# Patient Record
Sex: Female | Born: 1960
Health system: Southern US, Community
[De-identification: ages and names within clinical notes are randomized; demographics above are authoritative.]

## PROBLEM LIST (undated history)

## (undated) DIAGNOSIS — M7500 Adhesive capsulitis of unspecified shoulder: Secondary | ICD-10-CM

## (undated) DIAGNOSIS — R112 Nausea with vomiting, unspecified: Secondary | ICD-10-CM

## (undated) DIAGNOSIS — R51 Headache: Secondary | ICD-10-CM

## (undated) DIAGNOSIS — F410 Panic disorder [episodic paroxysmal anxiety] without agoraphobia: Secondary | ICD-10-CM

## (undated) DIAGNOSIS — R519 Headache, unspecified: Secondary | ICD-10-CM

## (undated) DIAGNOSIS — Z973 Presence of spectacles and contact lenses: Secondary | ICD-10-CM

## (undated) DIAGNOSIS — Z9889 Other specified postprocedural states: Secondary | ICD-10-CM

## (undated) DIAGNOSIS — M1990 Unspecified osteoarthritis, unspecified site: Secondary | ICD-10-CM

## (undated) HISTORY — PX: CYST EXCISION: SHX5701

## (undated) HISTORY — PX: COLONOSCOPY WITH ESOPHAGOGASTRODUODENOSCOPY (EGD): SHX5779

## (undated) HISTORY — PX: TONSILLECTOMY: SUR1361

---

## 2015-12-30 ENCOUNTER — Emergency Department (INDEPENDENT_AMBULATORY_CARE_PROVIDER_SITE_OTHER): Payer: Managed Care, Other (non HMO)

## 2015-12-30 ENCOUNTER — Emergency Department (INDEPENDENT_AMBULATORY_CARE_PROVIDER_SITE_OTHER)
Admission: EM | Admit: 2015-12-30 | Discharge: 2015-12-30 | Disposition: A | Discharge status: Home / Self Care | Payer: Managed Care, Other (non HMO) | Source: Home / Self Care | Attending: Family Medicine | Admitting: Family Medicine

## 2015-12-30 ENCOUNTER — Encounter: Payer: Self-pay | Admitting: *Deleted

## 2015-12-30 DIAGNOSIS — M7541 Impingement syndrome of right shoulder: Secondary | ICD-10-CM

## 2015-12-30 DIAGNOSIS — M25511 Pain in right shoulder: Secondary | ICD-10-CM | POA: Diagnosis not present

## 2015-12-30 DIAGNOSIS — R0781 Pleurodynia: Secondary | ICD-10-CM | POA: Diagnosis not present

## 2015-12-30 DIAGNOSIS — M940 Chondrocostal junction syndrome [Tietze]: Secondary | ICD-10-CM

## 2015-12-30 MED ORDER — NAPROXEN 500 MG PO TABS: 500.0000 mg | ORAL_TABLET | Freq: Two times a day (BID) | ORAL | 1 refills | Status: DC

## 2015-12-30 NOTE — ED Triage Notes (Signed)
Pt c/o RT shoulder pain x 1 year. Denies injury. She saw sports med 11/16' received an injection in her shoulder without relief. For the past 2 days she c/o RUQ abd pain that goes through to her back. She is unsure if it is related to her shoulder pain.

## 2015-12-30 NOTE — ED Provider Notes (Signed)
[facility]    CSN: [id number] Arrival date & time: 12/30/15  1257     History   Chief Complaint Chief Complaint  Patient presents with  . Shoulder Pain    HPI Nicole Gray is a 55 y.o. female.   Patient recently moved to the area.  She complains of persistent right shoulder pain for about a year. She recalls that her right shoulder pain started when she reached back with her right hand/arm to pick up something, and felt a sharp sudden pain in her right shoulder.  The pain has persisted, worse with abduction.  She visited a PCP after the pain started, who injected her right shoulder with a corticosteroid which she states was not effective.  She also went to physical therapy for 3 sessions that also was not helpful. She also complains of a very mild cough for about 2 months without fever, wheezing, or shortness of breath.  Last night she developed sharp pain in her right anterior/inferior chest beneath her right breast.  The pain is worse with movement and inspiration, and radiates to her right shoulder and upper back.  She recalls no injury to her chest.  No recent URI symptoms.                                                           The history is provided by the patient.  Shoulder Pain  Location:  Shoulder Shoulder location:  R shoulder Injury: no   Pain details:    Quality:  Sharp and aching   Radiates to:  Back   Severity:  Mild   Onset quality:  Sudden   Duration:  12 months   Timing:  Constant   Progression:  Worsening Handedness:  Right-handed Dislocation: no   Prior injury to area:  No Relieved by:  NSAIDs Worsened by:  Movement Ineffective treatments:  NSAIDs Associated symptoms: back pain and decreased range of motion   Associated symptoms: no fatigue, no fever, no muscle weakness, no neck pain, no numbness, no stiffness, no swelling and no tingling   Risk factors: no recent illness   Chest Pain  Pain location:  R chest Pain quality: sharp     Pain radiates to:  R shoulder and upper back Pain severity:  Mild Onset quality:  Sudden Duration:  1 day Timing:  Constant Progression:  Unchanged Chronicity:  New Context: breathing, lifting, movement, raising an arm and at rest   Context: not stress and not trauma   Relieved by:  None tried Worsened by:  Certain positions, coughing, deep breathing and movement Ineffective treatments:  None tried Associated symptoms: back pain, cough and shortness of breath   Associated symptoms: no abdominal pain, no anorexia, no diaphoresis, no dysphagia, no fatigue, no fever, no heartburn, no lower extremity edema, no nausea, no near-syncope, no numbness, no palpitations and no vomiting   Risk factors: no smoking     History reviewed. No pertinent past medical history.  There are no active problems to display for this patient.   Past Surgical History:  Procedure Laterality Date  . TONSILLECTOMY      OB History    No data available       Home Medications    Prior to Admission medications   Medication Sig Start Date End  Date Taking? Authorizing Provider  naproxen (NAPROSYN) 500 MG tablet Take 1 tablet (500 mg total) by mouth 2 (two) times daily. (every 12 hours with food) 12/30/15   Lattie HawStephen A Jaeliana Lococo, MD    Family History Family History  Problem Relation Age of Onset  . Cancer Mother     breast    Social History Social History  Substance Use Topics  . Smoking status: Never Smoker  . Smokeless tobacco: Never Used  . Alcohol use Yes     Allergies   Patient has no known allergies.   Review of Systems Review of Systems  Constitutional: Negative for diaphoresis, fatigue and fever.  HENT: Negative for trouble swallowing.   Respiratory: Positive for cough and shortness of breath.   Cardiovascular: Positive for chest pain. Negative for palpitations and near-syncope.  Gastrointestinal: Negative for abdominal pain, anorexia, heartburn, nausea and vomiting.  Musculoskeletal:  Positive for back pain. Negative for neck pain and stiffness.  Neurological: Negative for numbness.  All other systems reviewed and are negative.    Physical Exam Triage Vital Signs ED Triage Vitals [12/30/15 1317]  Enc Vitals Group     BP 115/73     Pulse Rate 71     Resp 16     Temp 98.2 F (36.8 C)     Temp Source Oral     SpO2 98 %     Weight 157 lb (71.2 kg)     Height 5\' 3"  (1.6 m)     Head Circumference      Peak Flow      Pain Score      Pain Loc      Pain Edu?      Excl. in GC?    No data found.   Updated Vital Signs BP 115/73 (BP Location: Left Arm)   Pulse 71   Temp 98.2 F (36.8 C) (Oral)   Resp 16   Ht 5\' 3"  (1.6 m)   Wt 157 lb (71.2 kg)   SpO2 98%   BMI 27.81 kg/m   Visual Acuity Right Eye Distance:   Left Eye Distance:   Bilateral Distance:    Right Eye Near:   Left Eye Near:    Bilateral Near:     Physical Exam  Constitutional: She appears well-developed and well-nourished. No distress.  HENT:  Head: Normocephalic.  Right Ear: External ear normal.  Left Ear: External ear normal.  Nose: Nose normal.  Mouth/Throat: Oropharynx is clear and moist.  Eyes: Conjunctivae are normal. Pupils are equal, round, and reactive to light.  Neck: Normal range of motion. Neck supple.  Cardiovascular: Normal heart sounds.   Pulmonary/Chest: Breath sounds normal.        There is distinct tenderness over medial edges of right scapula, extending up to trapezius muscle.  There is tenderness to palpation over the right anterior costal margin and lower ribs as noted on diagram.    Abdominal: There is no tenderness.  Musculoskeletal: She exhibits no edema.       Right shoulder: She exhibits decreased range of motion, tenderness and abnormal pulse. She exhibits normal strength.       Arms: Right shoulder:  Patient has difficulty actively and passively abducting more than 20 degrees above horizontal.  She is able to perform Apley's test but with  difficulty.  Empty can test negative.  Good internal/external rotation range of motion and strength.  There is distinct tenderness to palpation over the right long head of biceps tendon.  There is also tenderness to palpation over the sub-acromial bursa.  Distal neurovascular function is intact.   Lymphadenopathy:    She has no cervical adenopathy.  Neurological: She is alert.  Skin: Skin is warm and dry. No rash noted.  Nursing note and vitals reviewed.    UC Treatments / Results  Labs (all labs ordered are listed, but only abnormal results are displayed) Labs Reviewed - No data to display  EKG  EKG Interpretation None       Radiology Dg Ribs Unilateral W/chest Right  Result Date: 12/30/2015 CLINICAL DATA:  Right lower rib pain for 1-2 weeks EXAM: RIGHT RIBS AND CHEST - 3+ VIEW COMPARISON:  None. FINDINGS: No fracture or other bone lesions are seen involving the ribs. There is no evidence of pneumothorax or pleural effusion. Both lungs are clear. Heart size and mediastinal contours are within normal limits. IMPRESSION: Negative. Electronically Signed   By: Jasmine PangKim  Fujinaga M.D.   On: 12/30/2015 14:15   Dg Shoulder Right  Result Date: 12/30/2015 CLINICAL DATA:  Persistent right shoulder pain EXAM: RIGHT SHOULDER - 2+ VIEW COMPARISON:  None. FINDINGS: There is no evidence of fracture or dislocation. Mild right AC joint degenerative change. The right lung apex is clear. IMPRESSION: No acute osseous abnormality Electronically Signed   By: Jasmine PangKim  Fujinaga M.D.   On: 12/30/2015 14:14    Procedures Procedures (including critical care time)  Medications Ordered in UC Medications - No data to display   Initial Impression / Assessment and Plan / UC Course  I have reviewed the triage vital signs and the nursing notes.  Pertinent labs & imaging results that were available during my care of the patient were reviewed by me and considered in my medical decision making (see chart for  details).  Clinical Course   Discussed patient with Dr. Clementeen GrahamEvan Corey. Begin Naproxen 500mg  BID pc. Apply ice pack for 20 to 30 minutes, 3 to 4 times daily  Continue until pain decreases. Begin range of motion and stretching exercises as tolerated. Followup with Dr. Clementeen GrahamEvan Corey ([facility]) for management.     Final Clinical Impressions(s) / UC Diagnoses   Final diagnoses:  Impingement syndrome of right shoulder  Costochondritis, acute    New Prescriptions Discharge Medication List as of 12/30/2015  3:06 PM    START taking these medications   Details  naproxen (NAPROSYN) 500 MG tablet Take 1 tablet (500 mg total) by mouth 2 (two) times daily. (every 12 hours with food), Starting Tue 12/30/2015, Print         Lattie HawStephen A Kynsley Whitehouse, MD 12/30/15 [phone]

## 2015-12-30 NOTE — Discharge Instructions (Signed)
Apply ice pack for 20 to 30 minutes, 3 to 4 times daily  Continue until pain decreases.  °Begin range of motion and stretching exercises as tolerated. °

## 2016-01-01 ENCOUNTER — Encounter: Payer: Self-pay | Admitting: Family Medicine

## 2016-01-01 ENCOUNTER — Ambulatory Visit (INDEPENDENT_AMBULATORY_CARE_PROVIDER_SITE_OTHER): Payer: Managed Care, Other (non HMO) | Admitting: Family Medicine

## 2016-01-01 DIAGNOSIS — M25511 Pain in right shoulder: Secondary | ICD-10-CM | POA: Insufficient documentation

## 2016-01-01 NOTE — Patient Instructions (Signed)
Thank you for coming in today. You should hear about MRI soon.  Make an appointment with me a few days after the MRI to go over findings.

## 2016-01-01 NOTE — Progress Notes (Signed)
   Subjective:    I'm seeing this patient as a consultation for:  Dr. Donna ChristenStephen Beese  CC: shoulder pain  HPI: Patient has had right shoulder pain for the past year. Woke up with it one morning; no trauma or precipitating event. She has tried NSAIDs, 4 sessions of PT, and a corticosteroid injection, none of which have helped. The pain is achy, located across the front of her shoulder, and goes down into her elbow. It is worse with movement. Denies numbness, tingling, or weakness in right arm. A few days ago, she woke up with point tenderness over her right upper back.  Past medical history, Surgical history, Family history not pertinant except as noted below, Social history, Allergies, and medications have been entered into the medical record, reviewed, and no changes needed.   Review of Systems: No headache, visual changes, nausea, vomiting, diarrhea, constipation, dizziness, abdominal pain, skin rash, fevers, chills, night sweats, weight loss, swollen lymph nodes, body aches, joint swelling, muscle aches, chest pain, shortness of breath, mood changes, visual or auditory hallucinations.   Objective:    Vitals:   01/01/16 1535  BP: 121/62  Pulse: 68   General: Well Developed, well nourished, and in no acute distress.  Neuro/Psych: Alert and oriented x3, extra-ocular muscles intact, able to move all 4 extremities, sensation grossly intact. Skin: Warm and dry, no rashes noted.  Respiratory: Not using accessory muscles, speaking in full sentences, trachea midline.  Cardiovascular: Pulses palpable, no extremity edema. Abdomen: Does not appear distended. MSK: R shoulder:  Normal appearing.  TTP AC joint and periscapular muscles and trap.  ROM normal but pain with abduction and cross over.  Pain on abduction and internal rotation, crossover compression test positive, empty can test negative. Normal strength and sensation, radial pulse 2+, biceps reflex 2+.    Dg Ribs Unilateral W/chest  Right  Result Date: 12/30/2015 CLINICAL DATA:  Right lower rib pain for 1-2 weeks EXAM: RIGHT RIBS AND CHEST - 3+ VIEW COMPARISON:  None. FINDINGS: No fracture or other bone lesions are seen involving the ribs. There is no evidence of pneumothorax or pleural effusion. Both lungs are clear. Heart size and mediastinal contours are within normal limits. IMPRESSION: Negative. Electronically Signed   By: Jasmine PangKim  Fujinaga M.D.   On: 12/30/2015 14:15   Dg Shoulder Right  Result Date: 12/30/2015 CLINICAL DATA:  Persistent right shoulder pain EXAM: RIGHT SHOULDER - 2+ VIEW COMPARISON:  None. FINDINGS: There is no evidence of fracture or dislocation. Mild right AC joint degenerative change. The right lung apex is clear. IMPRESSION: No acute osseous abnormality Electronically Signed   By: Jasmine PangKim  Fujinaga M.D.   On: 12/30/2015 14:14     Impression and Recommendations:    Assessment and Plan: 55 y.o. female with 1 year of ongoing right shoulder pain and new onset right trapezius pain.   Shoulder pain is likely combination of AC DJD (as seen on recent x-ray) with contributing RCT and possible subacromial bursitis. She has failed conservative management. Plan for MRI this week; follow up a few days after to discuss results. Anticipate possible AC joint and subacromial  injection.  Trapezius pain is likely muscle strain as a result of compensation for shoulder pain/scapular dyskinesis. Anticipate treating with heat and PT; discuss at next meeting.  Discussed warning signs or symptoms. Please see discharge instructions. Patient expresses understanding.

## 2016-01-05 ENCOUNTER — Ambulatory Visit (INDEPENDENT_AMBULATORY_CARE_PROVIDER_SITE_OTHER): Payer: Managed Care, Other (non HMO)

## 2016-01-05 ENCOUNTER — Other Ambulatory Visit: Payer: Managed Care, Other (non HMO)

## 2016-01-05 DIAGNOSIS — M25511 Pain in right shoulder: Secondary | ICD-10-CM

## 2016-01-05 DIAGNOSIS — M19011 Primary osteoarthritis, right shoulder: Secondary | ICD-10-CM | POA: Diagnosis not present

## 2016-01-05 DIAGNOSIS — M25411 Effusion, right shoulder: Secondary | ICD-10-CM

## 2016-01-05 DIAGNOSIS — M75101 Unspecified rotator cuff tear or rupture of right shoulder, not specified as traumatic: Secondary | ICD-10-CM

## 2016-01-06 ENCOUNTER — Encounter: Payer: Self-pay | Admitting: Family Medicine

## 2016-01-06 DIAGNOSIS — M75100 Unspecified rotator cuff tear or rupture of unspecified shoulder, not specified as traumatic: Secondary | ICD-10-CM | POA: Insufficient documentation

## 2016-01-08 ENCOUNTER — Ambulatory Visit: Payer: Managed Care, Other (non HMO) | Admitting: Family Medicine

## 2016-01-13 ENCOUNTER — Ambulatory Visit (INDEPENDENT_AMBULATORY_CARE_PROVIDER_SITE_OTHER): Payer: Managed Care, Other (non HMO) | Admitting: Family Medicine

## 2016-01-13 VITALS — BP 127/70 | HR 71 | Wt 158.0 lb

## 2016-01-13 DIAGNOSIS — M25511 Pain in right shoulder: Secondary | ICD-10-CM | POA: Diagnosis not present

## 2016-01-13 DIAGNOSIS — M75111 Incomplete rotator cuff tear or rupture of right shoulder, not specified as traumatic: Secondary | ICD-10-CM

## 2016-01-13 NOTE — Progress Notes (Addendum)
Nicole Gray is a 55 y.o. female who [facility] today for follow-up right shoulder pain. Patient is had right shoulder pain ongoing now for about a year. She has tried physical therapy home therapy and has had a previous blind but sounds like subacromial bursa injection and has not improved. She was seen and had recent MRI which showed acromioclavicular DJD and supraspinatus tendinopathy along with mild bursal surface tear. She notes continued pain especially with overhead motion. She is very frustrated by this and wants more definitive treatment if possible.   No past medical history on file. Past Surgical History:  Procedure Laterality Date  . TONSILLECTOMY     Social History  Substance Use Topics  . Smoking status: Never Smoker  . Smokeless tobacco: Never Used  . Alcohol use Yes     ROS:  As above   Medications: Current Outpatient Prescriptions  Medication Sig Dispense Refill  . naproxen (NAPROSYN) 500 MG tablet Take 1 tablet (500 mg total) by mouth 2 (two) times daily. (every 12 hours with food) 30 tablet 1   No current facility-administered medications for this visit.    No Known Allergies   Exam:  BP 127/70   Pulse 71   Wt 158 lb (71.7 kg)   BMI 27.99 kg/m  General: Well Developed, well nourished, and in no acute distress.  Neuro/Psych: Alert and oriented x3, extra-ocular muscles intact, able to move all 4 extremities, sensation grossly intact. Skin: Warm and dry, no rashes noted.  Respiratory: Not using accessory muscles, speaking in full sentences, trachea midline.  Cardiovascular: Pulses palpable, no extremity edema. Abdomen: Does not appear distended. MSK: Tender to palpation right acromioclavicular joint. Pain with abduction positive impingement testing positive crossover arm compression testing. Strength is intact. Pulses capillary refill sensation intact distally.  Procedure: Real-time Ultrasound  Guided Injection of right acromioclavicular joint  Device: GE Logiq E  Images permanently stored and available for review in the ultrasound unit. Verbal informed consent obtained. Discussed risks and benefits of procedure. Warned about infection bleeding damage to structures skin hypopigmentation and fat atrophy among others. Patient expresses understanding and agreement Time-out conducted.  Noted no overlying erythema, induration, or other signs of local infection.  Skin prepped in a sterile fashion.  Local anesthesia: Topical Ethyl chloride.  With sterile technique and under real time ultrasound guidance: 40 mg of Kenalog and 2 mL of Marcaine injected easily.  Completed without difficulty  Pain did not improve after injection. Advised to call if fevers/chills, erythema, induration, drainage, or persistent bleeding.  Images permanently stored and available for review in the ultrasound unit.  Impression: Technically successful ultrasound guided injection.    CLINICAL DATA:  Right shoulder pain for 1 year.  No known injury.  EXAM: MRI OF THE RIGHT SHOULDER WITHOUT CONTRAST  TECHNIQUE: Multiplanar, multisequence MR imaging of the shoulder was performed. No intravenous contrast was administered.  COMPARISON:  Plain films right shoulder 12/30/2015.  FINDINGS: Rotator cuff: Mild to moderate appearing rotator cuff tendinopathy is most notable in the supraspinatus. There is an undersurface tear of the anterior and far lateral supraspinatus measuring 0.4 cm from front to back. No retraction.  Muscles:  Normal in appearance without atrophy or focal lesion.  Biceps long head:  Intact.  Acromioclavicular Joint: Moderate degenerative change is seen. Type 2 acromion. Small amount of fluid is present in the subacromial/subdeltoid bursa.  Glenohumeral Joint: Unremarkable.  Labrum:  Intact.  Bones:  No fracture or worrisome  lesion.  Other:  None.  IMPRESSION: Mild to moderate appearing rotator cuff tendinopathy with a 0.4 cm in diameter undersurface tear of the anterior and far lateral supraspinatus. No retraction or atrophy.  Moderate acromioclavicular osteoarthritis.  Small volume of subacromial/subdeltoid fluid compatible with bursitis.   Electronically Signed   By: Drusilla Kannerhomas  Dalessio M.D.   On: 01/05/2016 14:19    No results found for this or any previous visit (from the past 48 hour(s)). No results found.    Assessment and Plan: 55 y.o. female with  Right shoulder pain. Very likely due to rotator cuff tendinopathy with tear. Patient has failed conservative management. Ultrasound guided diagnostic and therapeutic acromioclavicular joint injection today did not resolve her pain at all. I think the primary pain generator is likely the rotator cuff tendinopathy. At this point patient is frustrated and would like a surgical consultation.  Plan to refer to orthopedic surgery for evaluation potential management. Patient is not quite exhausted all conservative measures although I think is likely she won't get much better with further conservative measures.    Orders Placed This Encounter  Procedures  . Ambulatory referral to Orthopedic Surgery    Referral Priority:   Routine    Referral Type:   Surgical    Referral Reason:   Specialty Services Required    Referred to Provider:   Kathryne Hitchhristopher Y Blackman, MD    Requested Specialty:   Orthopedic Surgery    Number of Visits Requested:   1    Discussed warning signs or symptoms. Please see discharge instructions. Patient expresses understanding.

## 2016-01-13 NOTE — Patient Instructions (Addendum)
Thank you for coming in today. Call or go to the ER if you develop a large red swollen joint with extreme pain or oozing puss.    Shoulder Impingement Syndrome Rehab Ask your health care provider which exercises are safe for you. Do exercises exactly as told by your health care provider and adjust them as directed. It is normal to feel mild stretching, pulling, tightness, or discomfort as you do these exercises, but you should stop right away if you feel sudden pain or your pain gets worse.Do not begin these exercises until told by your health care provider. Stretching and range of motion exercise This exercise warms up your muscles and joints and improves the movement and flexibility of your shoulder. This exercise also helps to relieve pain and stiffness. Exercise A: Passive horizontal adduction 1. Sit or stand and pull your left / right elbow across your chest, toward your other shoulder. Stop when you feel a gentle stretch in the back of your shoulder and upper arm.  Keep your arm at shoulder height.  Keep your arm as close to your body as you comfortably can. 2. Hold for __________ seconds. 3. Slowly return to the starting position. Repeat __________ times. Complete this exercise __________ times a day. Strengthening exercises These exercises build strength and endurance in your shoulder. Endurance is the ability to use your muscles for a long time, even after they get tired. Exercise B: External rotation, isometric 1. Stand or sit in a doorway, facing the door frame. 2. Bend your left / right elbow and place the back of your wrist against the door frame. Only your wrist should be touching the frame. Keep your upper arm at your side. 3. Gently press your wrist against the door frame, as if you are trying to push your arm away from your abdomen.  Avoid shrugging your shoulder while you press your hand against the door frame. Keep your shoulder blade tucked down toward the middle of your  back. 4. Hold for __________ seconds. 5. Slowly release the tension, and relax your muscles completely before you do the exercise again. Repeat __________ times. Complete this exercise __________ times a day. Exercise C: Internal rotation, isometric 1. Stand or sit in a doorway, facing the door frame. 2. Bend your left / right elbow and place the inside of your wrist against the door frame. Only your wrist should be touching the frame. Keep your upper arm at your side. 3. Gently press your wrist against the door frame, as if you are trying to push your arm toward your abdomen.  Avoid shrugging your shoulder while you press your hand against the door frame. Keep your shoulder blade tucked down toward the middle of your back. 4. Hold for __________ seconds. 5. Slowly release the tension, and relax your muscles completely before you do the exercise again. Repeat __________ times. Complete this exercise __________ times a day. Exercise D: Scapular protraction, supine 1. Lie on your back on a firm surface. Hold a __________ weight in your left / right hand. 2. Raise your left / right arm straight into the air so your hand is directly above your shoulder joint. 3. Push the weight into the air so your shoulder lifts off of the surface that you are lying on. Do not move your head, neck, or back. 4. Hold for __________ seconds. 5. Slowly return to the starting position. Let your muscles relax completely before you repeat this exercise. Repeat __________ times. Complete this exercise __________  times a day. Exercise E: Scapular retraction 1. Sit in a stable chair without armrests, or stand. 2. Secure an exercise band to a stable object in front of you so the band is at shoulder height. 3. Hold one end of the exercise band in each hand. Your palms should face down. 4. Squeeze your shoulder blades together and move your elbows slightly behind you. Do not shrug your shoulders while you do this. 5. Hold  for __________ seconds. 6. Slowly return to the starting position. Repeat __________ times. Complete this exercise __________ times a day. Exercise F: Shoulder extension 1. Sit in a stable chair without armrests, or stand. 2. Secure an exercise band to a stable object in front of you where the band is above shoulder height. 3. Hold one end of the exercise band in each hand. 4. Straighten your elbows and lift your hands up to shoulder height. 5. Squeeze your shoulder blades together and pull your hands down to the sides of your thighs. Stop when your hands are straight down by your sides. Do not let your hands go behind your body. 6. Hold for __________ seconds. 7. Slowly return to the starting position. Repeat __________ times. Complete this exercise __________ times a day. This information is not intended to replace advice given to you by your health care provider. Make sure you discuss any questions you have with your health care provider. Document Released: 02/08/2005 Document Revised: 10/16/2015 Document Reviewed: 01/11/2015 [employer] Interactive Patient Education  2017 [employer]

## 2016-01-30 ENCOUNTER — Ambulatory Visit (INDEPENDENT_AMBULATORY_CARE_PROVIDER_SITE_OTHER): Payer: Managed Care, Other (non HMO) | Admitting: Orthopedic Surgery

## 2016-01-30 ENCOUNTER — Encounter (INDEPENDENT_AMBULATORY_CARE_PROVIDER_SITE_OTHER): Payer: Self-pay | Admitting: Orthopedic Surgery

## 2016-01-30 DIAGNOSIS — M7501 Adhesive capsulitis of right shoulder: Secondary | ICD-10-CM | POA: Diagnosis not present

## 2016-02-02 ENCOUNTER — Ambulatory Visit (INDEPENDENT_AMBULATORY_CARE_PROVIDER_SITE_OTHER): Payer: Self-pay | Admitting: Orthopaedic Surgery

## 2016-02-02 NOTE — Progress Notes (Signed)
Office Visit Note   Patient: Nicole Gray           Date of Birth: Jan 22, 1961           MRN: [id number] Visit Date: 01/30/2016 Requested by: Rodolph BongEvan S Corey, MD [address] [place], [place] [id number] PCP: No PCP Per Patient  Subjective: Chief Complaint  Patient presents with  . Right Shoulder - Pain    HPI Nicole Gray is a [age] show patient with one-year history of right shoulder pain.  Denies a history of injury.  In another area she did do physical therapy and had an injection which didn't help.  She's been to the urgent care where she was diagnosed with rotator cuff tear bone spur and bursitis.  [place] did recently moved from [place].  Hurts when she is doing certain movements.  Feels pain across the anterior aspect of her shoulder with extension.  It does radiate down to the elbow.  She describes having issues fastening her bra.  She can't sleep well.  She can't paddle in kayaking which is her main avocation              Review of Systems All systems reviewed are negative as they relate to the chief complaint within the history of present illness.  Patient denies  fevers or chills.    Assessment & Plan: Visit Diagnoses: No diagnosis found.  Plan: Impression is right frozen shoulder with partial thickness nonoperative rotator cuff tearing as well as potentially symptomatic before meals joint arthritis and shoulder bursitis.  I discussed with Tammatha at length operative and nonoperative management options for this problem.  I think her main issue is with the pain associated with the frozen shoulder.  She's had injections physical therapy is really not improving.  MRI scan does show partial-thickness rotator cuff tearing which is less than 50% thickness of the rotator cuff attachment.  She also has fairly significant acromioclavicular joint arthritis with mild edema in the distal clavicle as well as thickening of the inferior glenohumeral ligament.  After long discussions Nicole Gray desires  surgical intervention which I think in her case is reasonable at this time after a year of treatment.  Plan will be for manipulation arthroscopy with bursectomy and arthroscopic distal clavicle excision as well as rotator interval release.  I'll put her in a CPM machine 5-6 hours a day for the first week postop.  Risks and benefits of surgery discussed as well as the extensive rehabilitation required following surgery all questions answered  Follow-Up Instructions: No Follow-up on file.   Orders:  No orders of the defined types were placed in this encounter.  No orders of the defined types were placed in this encounter.     Procedures: No procedures performed   Clinical Data: No additional findings.  Objective: Vital Signs: There were no vitals taken for this visit.  Physical Exam  Constitutional: She appears well-developed.  HENT:  Head: Normocephalic.  Eyes: EOM are normal.  Neck: Normal range of motion.  Cardiovascular: Normal rate.   Pulmonary/Chest: Effort normal.  Neurological: She is alert.  Skin: Skin is warm.  Psychiatric: She has a normal mood and affect.    Ortho Exam examination of the right shoulder demonstrates about 30 less forward flexion on the right compared to the left.  She also has about 20 less external rotation on the right at 15 abduction compared to the left.  Rotator cuff strength is intact and symmetric to isolated infraspinatus supraspinatus  and subscap muscle testing.  She does have more before meals joint tenderness to direct palpation on the right compared to the left.  Motor sensory function to the hand is intact neck range of motion is full no other masses lymph adenopathy or skin changes noted in the right shoulder girdle region  Specialty Comments:  No specialty comments available.  Imaging: No results found.   PMFS History: Patient Active Problem List   Diagnosis Date Noted  . Rotator cuff tear 01/06/2016  . Right shoulder pain  01/01/2016   No past medical history on file.  Family History  Problem Relation Age of Onset  . Cancer Mother     breast    Past Surgical History:  Procedure Laterality Date  . TONSILLECTOMY     Social History   Occupational History  . Not on file.   Social History Main Topics  . Smoking status: Never Smoker  . Smokeless tobacco: Never Used  . Alcohol use Yes  . Drug use: No  . Sexual activity: Not on file

## 2016-02-05 ENCOUNTER — Telehealth (INDEPENDENT_AMBULATORY_CARE_PROVIDER_SITE_OTHER): Payer: Self-pay | Admitting: Radiology

## 2016-02-05 NOTE — Telephone Encounter (Signed)
Patient called and LMVM Monday 02/02/16, asking if the surgery was going to be arthroscopically or open.  I advised per Dr Randel Pigg note, arthroscopically.  Patient is anxious to get this done this year, as she has met her deductible.  She Nicole Gray not be able to afford the surgery otherwise.  Please get Taren to update phone number to [phone]- Thanks!

## 2016-02-06 ENCOUNTER — Other Ambulatory Visit (INDEPENDENT_AMBULATORY_CARE_PROVIDER_SITE_OTHER): Payer: Self-pay | Admitting: Orthopedic Surgery

## 2016-02-06 DIAGNOSIS — M7501 Adhesive capsulitis of right shoulder: Secondary | ICD-10-CM

## 2016-02-20 ENCOUNTER — Inpatient Hospital Stay (INDEPENDENT_AMBULATORY_CARE_PROVIDER_SITE_OTHER): Payer: Managed Care, Other (non HMO) | Admitting: Orthopaedic Surgery

## 2016-02-25 ENCOUNTER — Encounter (HOSPITAL_COMMUNITY)
Admission: RE | Admit: 2016-02-25 | Discharge: 2016-02-25 | Disposition: A | Discharge status: Home / Self Care | Payer: Managed Care, Other (non HMO) | Source: Ambulatory Visit | Attending: Orthopedic Surgery | Admitting: Orthopedic Surgery

## 2016-02-25 ENCOUNTER — Encounter (HOSPITAL_COMMUNITY): Payer: Self-pay

## 2016-02-25 DIAGNOSIS — Z01818 Encounter for other preprocedural examination: Secondary | ICD-10-CM | POA: Insufficient documentation

## 2016-02-25 HISTORY — DX: Headache: R51

## 2016-02-25 HISTORY — DX: Nausea with vomiting, unspecified: R11.2

## 2016-02-25 HISTORY — DX: Headache, unspecified: R51.9

## 2016-02-25 HISTORY — DX: Presence of spectacles and contact lenses: Z97.3

## 2016-02-25 HISTORY — DX: Panic disorder (episodic paroxysmal anxiety): F41.0

## 2016-02-25 HISTORY — DX: Unspecified osteoarthritis, unspecified site: M19.90

## 2016-02-25 HISTORY — DX: Other specified postprocedural states: Z98.890

## 2016-02-25 HISTORY — DX: Adhesive capsulitis of unspecified shoulder: M75.00

## 2016-02-25 LAB — CBC
HCT: 40.3 % (ref 36.0–46.0)
Hemoglobin: 13.8 g/dL (ref 12.0–15.0)
MCH: 30.5 pg (ref 26.0–34.0)
MCHC: 34.2 g/dL (ref 30.0–36.0)
MCV: 89 fL (ref 78.0–100.0)
PLATELETS: 286 10*3/uL (ref 150–400)
RBC: 4.53 MIL/uL (ref 3.87–5.11)
RDW: 13.8 % (ref 11.5–15.5)
WBC: 7.3 10*3/uL (ref 4.0–10.5)

## 2016-02-25 NOTE — Pre-Procedure Instructions (Signed)
    Eldridge ScotLucia Houchins  02/25/2016      [facility] [id number] - [place] - [address] [facility] [address] [place] [id number] Phone: [phone] Fax: [phone]    Your procedure is scheduled on Thursday, March 04, 2016  [facility] Admitting at 10:30 A.M.  Call this number if you have problems the morning of surgery:  [phone]   Remember:  Do not eat food or drink liquids after midnight Wednesday, March 03, 2016  Take these medicines the morning of surgery with A SIP OF WATER : None Stop taking Aspirin, vitamins, fish oil and herbal medications. Do not take any NSAIDs ie: Ibuprofen, Advil, Naproxen, BC and Goody Powder or any medication containing Aspirin; stop now.  Do not wear jewelry, make-up or nail polish.  Do not wear lotions, powders, or perfumes, or deoderant.  Do not shave 48 hours prior to surgery.    Do not bring valuables to the hospital.  [facility] is not responsible for any belongings or valuables.  Contacts, dentures or bridgework may not be worn into surgery.  Leave your suitcase in the car.  After surgery it may be brought to your room. For patients admitted to the hospital, discharge time will be determined by your treatment team. Patients discharged the day of surgery will not be allowed to drive home.  Special instructions: Shower the night before surgery and the morning of surgery with CHG. Please read over the following fact sheets that you were given. Pain Booklet, Coughing and Deep Breathing and Surgical Site Infection Prevention

## 2016-02-25 NOTE — Progress Notes (Signed)
Pt denies SOB, chest pain, and being under the care of a cardiologist. Pt denies having a stress test, echo and cardiac cath. Pt denies having a chest x ray and EKG within the last year. Pt denies having recent labs.  

## 2016-03-03 MED ORDER — CHLORHEXIDINE GLUCONATE 4 % EX LIQD: 60.0000 mL | Freq: Once | CUTANEOUS | Status: DC

## 2016-03-03 MED ORDER — CEFAZOLIN SODIUM-DEXTROSE 2-4 GM/100ML-% IV SOLN
2.0000 g | INTRAVENOUS | Status: AC
  Administered 2016-03-04: 2 g via INTRAVENOUS
  Filled 2016-03-03: qty 100

## 2016-03-04 ENCOUNTER — Encounter (HOSPITAL_COMMUNITY)
Admission: RE | Disposition: A | Discharge status: Home / Self Care | Payer: Self-pay | Source: Ambulatory Visit | Attending: Orthopedic Surgery

## 2016-03-04 ENCOUNTER — Ambulatory Visit (HOSPITAL_COMMUNITY): Payer: Managed Care, Other (non HMO) | Admitting: Anesthesiology

## 2016-03-04 ENCOUNTER — Ambulatory Visit (HOSPITAL_COMMUNITY)
Admission: RE | Admit: 2016-03-04 | Discharge: 2016-03-04 | Disposition: A | Discharge status: Home / Self Care | Payer: Managed Care, Other (non HMO) | Source: Ambulatory Visit | Attending: Orthopedic Surgery | Admitting: Orthopedic Surgery

## 2016-03-04 ENCOUNTER — Encounter (HOSPITAL_COMMUNITY): Payer: Self-pay | Admitting: Surgery

## 2016-03-04 DIAGNOSIS — M19011 Primary osteoarthritis, right shoulder: Secondary | ICD-10-CM | POA: Insufficient documentation

## 2016-03-04 DIAGNOSIS — F410 Panic disorder [episodic paroxysmal anxiety] without agoraphobia: Secondary | ICD-10-CM | POA: Diagnosis not present

## 2016-03-04 DIAGNOSIS — M19019 Primary osteoarthritis, unspecified shoulder: Secondary | ICD-10-CM

## 2016-03-04 DIAGNOSIS — M75111 Incomplete rotator cuff tear or rupture of right shoulder, not specified as traumatic: Secondary | ICD-10-CM | POA: Insufficient documentation

## 2016-03-04 DIAGNOSIS — M7501 Adhesive capsulitis of right shoulder: Secondary | ICD-10-CM

## 2016-03-04 SURGERY — SHOULDER ARTHROSCOPY WITH SUBACROMIAL DECOMPRESSION AND DISTAL CLAVICLE EXCISION
Anesthesia: General | Site: Shoulder | Laterality: Right

## 2016-03-04 MED ORDER — DEXTROSE 5 % IV SOLN
INTRAVENOUS | Status: DC | PRN
  Administered 2016-03-04: 15:00:00 via INTRAVENOUS

## 2016-03-04 MED ORDER — 0.9 % SODIUM CHLORIDE (POUR BTL) OPTIME
TOPICAL | Status: DC | PRN
  Administered 2016-03-04: 1000 mL

## 2016-03-04 MED ORDER — BUPIVACAINE HCL (PF) 0.25 % IJ SOLN
INTRAMUSCULAR | Status: AC
  Filled 2016-03-04: qty 30

## 2016-03-04 MED ORDER — PROPOFOL 10 MG/ML IV BOLUS
INTRAVENOUS | Status: DC | PRN
  Administered 2016-03-04: 150 mg via INTRAVENOUS

## 2016-03-04 MED ORDER — MIDAZOLAM HCL 2 MG/2ML IJ SOLN
INTRAMUSCULAR | Status: AC
  Administered 2016-03-04: 2 mg
  Filled 2016-03-04: qty 2

## 2016-03-04 MED ORDER — SODIUM CHLORIDE 0.9 % IJ SOLN
INTRAMUSCULAR | Status: DC | PRN
  Administered 2016-03-04 (×2): 10 mL

## 2016-03-04 MED ORDER — OXYCODONE HCL 5 MG PO TABS
5.0000 mg | ORAL_TABLET | Freq: Once | ORAL | Status: AC
  Administered 2016-03-04: 5 mg via ORAL

## 2016-03-04 MED ORDER — SUGAMMADEX SODIUM 200 MG/2ML IV SOLN
INTRAVENOUS | Status: AC
  Filled 2016-03-04: qty 2

## 2016-03-04 MED ORDER — FENTANYL CITRATE (PF) 100 MCG/2ML IJ SOLN
INTRAMUSCULAR | Status: AC
  Filled 2016-03-04: qty 2

## 2016-03-04 MED ORDER — ROCURONIUM BROMIDE 100 MG/10ML IV SOLN
INTRAVENOUS | Status: DC | PRN
  Administered 2016-03-04: 50 mg via INTRAVENOUS

## 2016-03-04 MED ORDER — ONDANSETRON HCL 4 MG/2ML IJ SOLN
INTRAMUSCULAR | Status: AC
  Filled 2016-03-04: qty 2

## 2016-03-04 MED ORDER — SODIUM CHLORIDE 0.9 % IR SOLN
Status: DC | PRN
  Administered 2016-03-04 (×2): 3000 mL

## 2016-03-04 MED ORDER — PROMETHAZINE HCL 25 MG/ML IJ SOLN: 6.2500 mg | INTRAMUSCULAR | Status: DC | PRN

## 2016-03-04 MED ORDER — MIDAZOLAM HCL 2 MG/2ML IJ SOLN
INTRAMUSCULAR | Status: AC
  Filled 2016-03-04: qty 2

## 2016-03-04 MED ORDER — OXYCODONE HCL 5 MG PO TABS
ORAL_TABLET | ORAL | Status: AC
  Filled 2016-03-04: qty 1

## 2016-03-04 MED ORDER — BUPIVACAINE-EPINEPHRINE (PF) 0.5% -1:200000 IJ SOLN
INTRAMUSCULAR | Status: DC | PRN
  Administered 2016-03-04: 25 mL via PERINEURAL

## 2016-03-04 MED ORDER — SUGAMMADEX SODIUM 200 MG/2ML IV SOLN
INTRAVENOUS | Status: DC | PRN
  Administered 2016-03-04: 200 mg via INTRAVENOUS

## 2016-03-04 MED ORDER — LACTATED RINGERS IV SOLN
INTRAVENOUS | Status: DC
  Administered 2016-03-04 (×3): via INTRAVENOUS

## 2016-03-04 MED ORDER — BUPIVACAINE HCL (PF) 0.25 % IJ SOLN
INTRAMUSCULAR | Status: DC | PRN
  Administered 2016-03-04: 10 mL

## 2016-03-04 MED ORDER — EPINEPHRINE PF 1 MG/ML IJ SOLN
INTRAMUSCULAR | Status: AC
  Filled 2016-03-04: qty 1

## 2016-03-04 MED ORDER — DEXAMETHASONE SODIUM PHOSPHATE 10 MG/ML IJ SOLN
INTRAMUSCULAR | Status: AC
  Filled 2016-03-04: qty 2

## 2016-03-04 MED ORDER — PROPOFOL 10 MG/ML IV BOLUS
INTRAVENOUS | Status: AC
  Filled 2016-03-04: qty 20

## 2016-03-04 MED ORDER — DEXAMETHASONE SODIUM PHOSPHATE 10 MG/ML IJ SOLN
INTRAMUSCULAR | Status: DC | PRN
  Administered 2016-03-04: 10 mg via INTRAVENOUS

## 2016-03-04 MED ORDER — LIDOCAINE HCL (CARDIAC) 20 MG/ML IV SOLN
INTRAVENOUS | Status: DC | PRN
  Administered 2016-03-04: 70 mg via INTRAVENOUS

## 2016-03-04 MED ORDER — EPINEPHRINE PF 1 MG/ML IJ SOLN
INTRAMUSCULAR | Status: AC
  Filled 2016-03-04: qty 4

## 2016-03-04 MED ORDER — ONDANSETRON HCL 4 MG/2ML IJ SOLN
INTRAMUSCULAR | Status: DC | PRN
Start: 2016-03-04 — End: 2016-03-04
  Administered 2016-03-04: 4 mg via INTRAVENOUS

## 2016-03-04 MED ORDER — FENTANYL CITRATE (PF) 100 MCG/2ML IJ SOLN
INTRAMUSCULAR | Status: DC | PRN
  Administered 2016-03-04: 50 ug via INTRAVENOUS
  Administered 2016-03-04: 100 ug via INTRAVENOUS
  Administered 2016-03-04: 50 ug via INTRAVENOUS

## 2016-03-04 MED ORDER — MIDAZOLAM HCL 5 MG/5ML IJ SOLN
INTRAMUSCULAR | Status: DC | PRN
  Administered 2016-03-04: 2 mg via INTRAVENOUS

## 2016-03-04 MED ORDER — PHENYLEPHRINE HCL 10 MG/ML IJ SOLN
INTRAVENOUS | Status: DC | PRN
  Administered 2016-03-04: 20 ug/min via INTRAVENOUS

## 2016-03-04 MED ORDER — FENTANYL CITRATE (PF) 100 MCG/2ML IJ SOLN
INTRAMUSCULAR | Status: AC
  Filled 2016-03-04: qty 4

## 2016-03-04 MED ORDER — EPINEPHRINE PF 1 MG/ML IJ SOLN
INTRAMUSCULAR | Status: DC | PRN
  Administered 2016-03-04 (×2): 1 mg
  Administered 2016-03-04: .1 mg

## 2016-03-04 MED ORDER — STERILE WATER FOR IRRIGATION IR SOLN
Status: DC | PRN
  Administered 2016-03-04: 1000 mL

## 2016-03-04 MED ORDER — HYDROMORPHONE HCL 1 MG/ML IJ SOLN: 0.2500 mg | INTRAMUSCULAR | Status: DC | PRN

## 2016-03-04 SURGICAL SUPPLY — 68 items
ANCHOR SUT BIOCOMP CORKSREW (Anchor) ×3 IMPLANT
BENZOIN TINCTURE PRP APPL 2/3 (GAUZE/BANDAGES/DRESSINGS) ×3 IMPLANT
BLADE CUTTER GATOR 3.5 (BLADE) ×3 IMPLANT
BLADE GREAT WHITE 4.2 (BLADE) ×2 IMPLANT
BLADE GREAT WHITE 4.2MM (BLADE) ×1
BLADE SURG 11 STRL SS (BLADE) ×6 IMPLANT
BUR OVAL 6.0 (BURR) ×3 IMPLANT
CLOSURE WOUND 1/2 X4 (GAUZE/BANDAGES/DRESSINGS) ×1
COVER SURGICAL LIGHT HANDLE (MISCELLANEOUS) ×3 IMPLANT
DRAPE INCISE IOBAN 66X45 STRL (DRAPES) ×6 IMPLANT
DRAPE STERI 35X30 U-POUCH (DRAPES) ×3 IMPLANT
DRAPE U-SHAPE 47X51 STRL (DRAPES) ×6 IMPLANT
DRSG PAD ABDOMINAL 8X10 ST (GAUZE/BANDAGES/DRESSINGS) ×9 IMPLANT
DRSG TEGADERM 4X4.75 (GAUZE/BANDAGES/DRESSINGS) ×3 IMPLANT
DURAPREP 26ML APPLICATOR (WOUND CARE) ×3 IMPLANT
ELECT REM PT RETURN 9FT ADLT (ELECTROSURGICAL) ×3
ELECTRODE REM PT RTRN 9FT ADLT (ELECTROSURGICAL) ×1 IMPLANT
FILTER STRAW FLUID ASPIR (MISCELLANEOUS) ×9 IMPLANT
GAUZE SPONGE 4X4 12PLY STRL (GAUZE/BANDAGES/DRESSINGS) ×3 IMPLANT
GAUZE XEROFORM 1X8 LF (GAUZE/BANDAGES/DRESSINGS) ×3 IMPLANT
GLOVE BIOGEL PI IND STRL 8 (GLOVE) ×1 IMPLANT
GLOVE BIOGEL PI INDICATOR 8 (GLOVE) ×2
GLOVE SURG ORTHO 8.0 STRL STRW (GLOVE) ×3 IMPLANT
GOWN STRL REUS W/ TWL LRG LVL3 (GOWN DISPOSABLE) ×3 IMPLANT
GOWN STRL REUS W/TWL LRG LVL3 (GOWN DISPOSABLE) ×6
KIT BASIN OR (CUSTOM PROCEDURE TRAY) ×3 IMPLANT
KIT ROOM TURNOVER OR (KITS) ×3 IMPLANT
MANIFOLD NEPTUNE II (INSTRUMENTS) ×3 IMPLANT
NDL SUT 6 .5 CRC .975X.05 MAYO (NEEDLE) ×2 IMPLANT
NEEDLE HYPO 25GX1X1/2 BEV (NEEDLE) ×3 IMPLANT
NEEDLE HYPO 25X1 1.5 SAFETY (NEEDLE) ×3 IMPLANT
NEEDLE MAYO TAPER (NEEDLE) ×4
NEEDLE SCORPION MULTI FIRE (NEEDLE) ×3 IMPLANT
NEEDLE SPNL 18GX3.5 QUINCKE PK (NEEDLE) ×3 IMPLANT
NS IRRIG 1000ML POUR BTL (IV SOLUTION) ×3 IMPLANT
PACK SHOULDER (CUSTOM PROCEDURE TRAY) ×3 IMPLANT
PAD ARMBOARD 7.5X6 YLW CONV (MISCELLANEOUS) ×6 IMPLANT
PUSHLOCK PEEK 4.5X24 (Orthopedic Implant) ×6 IMPLANT
RESTRAINT HEAD UNIVERSAL NS (MISCELLANEOUS) ×3 IMPLANT
SET ARTHROSCOPY TUBING (MISCELLANEOUS) ×2
SET ARTHROSCOPY TUBING LN (MISCELLANEOUS) ×1 IMPLANT
SLING ARM IMMOBILIZER LRG (SOFTGOODS) IMPLANT
SLING ARM IMMOBILIZER MED (SOFTGOODS) ×3 IMPLANT
SPONGE GAUZE 4X4 12PLY STER LF (GAUZE/BANDAGES/DRESSINGS) ×3 IMPLANT
SPONGE LAP 4X18 X RAY DECT (DISPOSABLE) ×9 IMPLANT
STRIP CLOSURE SKIN 1/2X4 (GAUZE/BANDAGES/DRESSINGS) ×2 IMPLANT
SUCTION FRAZIER HANDLE 10FR (MISCELLANEOUS) ×2
SUCTION TUBE FRAZIER 10FR DISP (MISCELLANEOUS) ×1 IMPLANT
SUT ETHILON 3 0 PS 1 (SUTURE) ×6 IMPLANT
SUT FIBERWIRE #2 38 T-5 BLUE (SUTURE)
SUT MNCRL AB 3-0 PS2 18 (SUTURE) ×3 IMPLANT
SUT PROLENE 3 0 PS 2 (SUTURE) IMPLANT
SUT VIC AB 0 CT1 27 (SUTURE) ×2
SUT VIC AB 0 CT1 27XBRD ANBCTR (SUTURE) ×1 IMPLANT
SUT VIC AB 1 CT1 27 (SUTURE)
SUT VIC AB 1 CT1 27XBRD ANBCTR (SUTURE) IMPLANT
SUT VIC AB 2-0 CT1 27 (SUTURE) ×2
SUT VIC AB 2-0 CT1 TAPERPNT 27 (SUTURE) ×1 IMPLANT
SUT VIC AB 3-0 FS2 27 (SUTURE) ×3 IMPLANT
SUTURE FIBERWR #2 38 T-5 BLUE (SUTURE) IMPLANT
SYR 20CC LL (SYRINGE) ×6 IMPLANT
SYR 3ML LL SCALE MARK (SYRINGE) ×3 IMPLANT
SYR TB 1ML LUER SLIP (SYRINGE) ×3 IMPLANT
TOWEL OR 17X24 6PK STRL BLUE (TOWEL DISPOSABLE) ×3 IMPLANT
TOWEL OR 17X26 10 PK STRL BLUE (TOWEL DISPOSABLE) ×3 IMPLANT
WAND HAND CNTRL MULTIVAC 90 (MISCELLANEOUS) IMPLANT
WAND STAR VAC 90 (SURGICAL WAND) ×3 IMPLANT
WATER STERILE IRR 1000ML POUR (IV SOLUTION) ×3 IMPLANT

## 2016-03-04 NOTE — Anesthesia Procedure Notes (Signed)
Anesthesia Regional Block:  Interscalene brachial plexus block  Pre-Anesthetic Checklist: ,, timeout performed, Correct Patient, Correct Site, Correct Laterality, Correct Procedure, Correct Position, site marked, Risks and benefits discussed,  Surgical consent,  Pre-op evaluation,  At surgeon's request and post-op pain management  Laterality: Right  Prep: chloraprep       Needles:  Injection technique: Single-shot  Needle Type: Echogenic Stimulator Needle     Needle Length: 9cm 9 cm Needle Gauge: 21 and 21 G    Additional Needles:  Procedures: ultrasound guided (picture in chart) and nerve stimulator Interscalene brachial plexus block  Nerve Stimulator or Paresthesia:  Response: deltiod, biceps, triceps, 0.5 mA,   Additional Responses:   Narrative:  Start time: 03/04/2016 12:00 PM End time: 03/04/2016 12:08 PM Injection made incrementally with aspirations every 5 mL.  Performed by: Personally  Anesthesiologist: Marcene DuosFITZGERALD, Nicole Gray

## 2016-03-04 NOTE — Anesthesia Procedure Notes (Addendum)
Procedure Name: Intubation Date/Time: 03/04/2016 2:44 PM Performed by: Salli Quarry WEAVER Pre-anesthesia Checklist: Patient identified, Emergency Drugs available, Suction available and Patient being monitored Patient Re-evaluated:Patient Re-evaluated prior to inductionOxygen Delivery Method: Circle System Utilized Preoxygenation: Pre-oxygenation with 100% oxygen Intubation Type: IV induction and Cricoid Pressure applied Ventilation: Mask ventilation without difficulty Laryngoscope Size: Mac and 3 Grade View: Grade III Tube type: Oral Tube size: 7.0 mm Number of attempts: 1 Airway Equipment and Method: Stylet and Oral airway Placement Confirmation: ETT inserted through vocal cords under direct vision,  positive ETCO2 and breath sounds checked- equal and bilateral Secured at: 22 cm Tube secured with: Tape Dental Injury: Teeth and Oropharynx as per pre-operative assessment

## 2016-03-04 NOTE — Anesthesia Preprocedure Evaluation (Addendum)
Anesthesia Evaluation  Patient identified by MRN, date of birth, ID band Patient awake    Reviewed: Allergy & Precautions, NPO status , Patient's Chart, lab work & pertinent test results  Airway Mallampati: III  TM Distance: >3 FB Neck ROM: Full    Dental  (+) Dental Advisory Given   Pulmonary neg pulmonary ROS,    breath sounds clear to auscultation       Cardiovascular negative cardio ROS   Rhythm:Regular Rate:Normal     Neuro/Psych negative neurological ROS     GI/Hepatic negative GI ROS, Neg liver ROS,   Endo/Other  negative endocrine ROS  Renal/GU negative Renal ROS     Musculoskeletal  (+) Arthritis ,   Abdominal   Peds  Hematology negative hematology ROS (+)   Anesthesia Other Findings   Reproductive/Obstetrics                            Lab Results  Component Value Date   WBC 7.3 02/25/2016   HGB 13.8 02/25/2016   HCT 40.3 02/25/2016   MCV 89.0 02/25/2016   PLT 286 02/25/2016   No results found for: CREATININE, BUN, NA, K, CL, CO2  Anesthesia Physical Anesthesia Plan  ASA: I  Anesthesia Plan: General   Post-op Pain Management:  Regional for Post-op pain   Induction: Intravenous  Airway Management Planned: Oral ETT  Additional Equipment:   Intra-op Plan:   Post-operative Plan: Extubation in OR  Informed Consent: I have reviewed the patients History and Physical, chart, labs and discussed the procedure including the risks, benefits and alternatives for the proposed anesthesia with the patient or authorized representative who has indicated his/her understanding and acceptance.   Dental advisory given  Plan Discussed with: CRNA  Anesthesia Plan Comments:         Anesthesia Quick Evaluation

## 2016-03-04 NOTE — H&P (Signed)
Nicole Gray is an 56 y.o. female.   Chief Complaint: Right shoulder pain HPI:  Nicole Gray is a 1256 show patient with one-year history of right shoulder pain.  Denies a history of injury.  In another area she did do physical therapy and had an injection which didn't help.  She's been to the urgent care where she was diagnosed with rotator cuff tear bone spur and bursitis.  She did recently moved from [place].  Hurts when she is doing certain movements.  Feels pain across the anterior aspect of her shoulder with extension.  It does radiate down to the elbow.  She describes having issues fastening her bra.  She can't sleep well.  She can't paddle in kayaking which is her main avocation  Past Medical History:  Diagnosis Date  . Frozen shoulder   . Headache    migraines  . Osteoarthritis    AC  . Panic attacks   . PONV (postoperative nausea and vomiting)    " I had a  mild panic attack coming out of anesthesia"  . Wears glasses     Past Surgical History:  Procedure Laterality Date  . COLONOSCOPY WITH ESOPHAGOGASTRODUODENOSCOPY (EGD)    . CYST EXCISION    . TONSILLECTOMY      Family History  Problem Relation Age of Onset  . Cancer Mother     breast  . Hypertension Mother    Social History:  reports that she has never smoked. She has never used smokeless tobacco. She reports that she drinks alcohol. She reports that she does not use drugs.  Allergies: No Known Allergies  Medications Prior to Admission  Medication Sig Dispense Refill  . ibuprofen (ADVIL,MOTRIN) 200 MG tablet Take 200 mg by mouth every 6 (six) hours as needed for headache or moderate pain.      No results found for this or any previous visit (from the past 48 hour(s)). No results found.  Review of Systems  Musculoskeletal: Positive for joint pain.  All other systems reviewed and are negative.   Blood pressure (!) 142/73, pulse (!) 102, temperature 98 F (36.7 C), temperature source Oral, resp. rate 20, weight 161 lb  (73 kg), SpO2 98 %. Physical Exam Ortho Exam examination of the right shoulder demonstrates about 30 less forward flexion on the right compared to the left.  She also has about 20 less external rotation on the right at 15 abduction compared to the left.  Rotator cuff strength is intact and symmetric to isolated infraspinatus supraspinatus and subscap muscle testing.  She does have more acromioclavicular joint tenderness to direct palpation on the right compared to the left.  Motor sensory function to the hand is intact neck range of motion is full no other masses lymph adenopathy or skin changes noted in the right shoulder girdle region  Assessment/Plan Impression is right frozen shoulder with partial thickness nonoperative rotator cuff tearing as well as potentially symptomatic before meals joint arthritis and shoulder bursitis.  I discussed with Nicole Gray at length operative and nonoperative management options for this problem.  I think her main issue is with the pain associated with the frozen shoulder.  She's had injections physical therapy is really not improving.  MRI scan does show partial-thickness rotator cuff tearing which is less than 50% thickness of the rotator cuff attachment.  She also has fairly significant acromioclavicular joint arthritis with mild edema in the distal clavicle as well as thickening of the inferior glenohumeral ligament.  After long discussions [address]  desires surgical intervention which I think in her case is reasonable at this time after a year of treatment.  Plan will be for manipulation arthroscopy with bursectomy and arthroscopic distal clavicle excision as well as rotator interval release.  I'll put her in a CPM machine 5-6 hours a day for the first week postop.  Risks and benefits of surgery discussed as well as the extensive rehabilitation required following surgery all questions answered   Burnard Bunting, MD 03/04/2016, 11:58 AM

## 2016-03-04 NOTE — Anesthesia Postprocedure Evaluation (Signed)
Anesthesia Post Note  Patient: Nicole Gray  Procedure(s) Performed: Procedure(s) (LRB): SHOULDER ARTHROSCOPY WITH SUBACROMIAL DECOMPRESSION AND DISTAL CLAVICLE EXCISION, WITH MANPULATION UNDER ANESTHESIA. (Right)  Patient location during evaluation: PACU Anesthesia Type: General Level of consciousness: awake Pain management: pain level controlled Respiratory status: spontaneous breathing Cardiovascular status: stable Anesthetic complications: no       Last Vitals:  Vitals:   03/04/16 1801 03/04/16 1811  BP: (!) 105/49   Pulse:  77  Resp:  20  Temp:      Last Pain:  Vitals:   03/04/16 1745  TempSrc:   PainSc: 0-No pain                 Ryder Chesmore

## 2016-03-04 NOTE — Brief Op Note (Signed)
03/04/2016  4:41 PM  PATIENT:  Nicole Gray  56 y.o. female  PRE-OPERATIVE DIAGNOSIS:  Right frozen shoulder and acromioclavicular osteoarthritis.  POST-OPERATIVE DIAGNOSIS:  Right frozen shoulder and acromioclavicular osteoarthritis. Rotator cuff tear  PROCEDURE:  Procedure(s): SHOULDER ARTHROSCOPY WITH SUBACROMIAL DECOMPRESSION AND DISTAL CLAVICLE EXCISION, WITH MANPULATION UNDER ANESTHESIA.rotator cuff tear repair  SURGEON:  Surgeon(s): Cammy CopaScott Gregory Dean, MD  ASSISTANT: April Green RNFA  ANESTHESIA:   general  EBL: 45 ml    Total I/O In: 1100 [I.V.:1100] Out: 25 [Blood:25]  BLOOD ADMINISTERED: none  DRAINS: none   LOCAL MEDICATIONS USED:  none  SPECIMEN:  No Specimen  COUNTS:  YES  TOURNIQUET:  * No tourniquets in log *  DICTATION: .Other Dictation: Dictation Number [id number]  PLAN OF CARE: Discharge to home after PACU  PATIENT DISPOSITION:  PACU - hemodynamically stable

## 2016-03-04 NOTE — Transfer of Care (Signed)
Immediate Anesthesia Transfer of Care Note  Patient: Nena PolioLucy Plush  Procedure(s) Performed: Procedure(s): SHOULDER ARTHROSCOPY WITH SUBACROMIAL DECOMPRESSION AND DISTAL CLAVICLE EXCISION, WITH MANPULATION UNDER ANESTHESIA. (Right)  Patient Location: PACU  Anesthesia Type:GA combined with regional for post-op pain  Level of Consciousness: sedated  Airway & Oxygen Therapy: Patient Spontanous Breathing and Patient connected to face mask oxygen  Post-op Assessment: Report given to RN and Post -op Vital signs reviewed and stable  Post vital signs: Reviewed and stable  Last Vitals:  Vitals:   03/04/16 1032  BP: (!) 142/73  Pulse: (!) 102  Resp: 20  Temp: 36.7 C    Last Pain:  Vitals:   03/04/16 1032  TempSrc: Oral      Patients Stated Pain Goal: 0 (03/04/16 1039)  Complications: No apparent anesthesia complications

## 2016-03-05 NOTE — Op Note (Signed)
NAME:  Nicole Gray, Nicole Gray                    ACCOUNT NO.:  MEDICAL RECORD NO.:  [id number]  LOCATION:                                 FACILITY:  PHYSICIAN:  Burnard BuntingG. Scott Macintyre Alexa, M.D.         DATE OF BIRTH:  DATE OF PROCEDURE: DATE OF DISCHARGE:                              OPERATIVE REPORT   PREOPERATIVE DIAGNOSES:  Right frozen shoulder and acromioclavicular joint arthritis.  POSTOPERATIVE DIAGNOSIS:  Right frozen shoulder and acromioclavicular joint arthritis with small 5 x 5-mm full-thickness rotator cuff tear.  PROCEDURE:  Right shoulder manipulation under anesthesia, arthroscopy, arthroscopic distal clavicle excision, mini open rotator cuff tear repair.  SURGEON:  Burnard BuntingG. Scott Lemoyne Scarpati, M.D.  ASSISTANT:  April Chilton SiGreen, RNFA.  INDICATIONS:  Nicole Gray a 56 year old patient with right shoulder pain, presents for operative management after explanation of risks and benefits.  Had frozen shoulder and AC joint arthritis by MRI scanning along with partial to full-thickness rotator cuff tear.  Presents now for operative management after explanation of risks and benefits.  PROCEDURE IN DETAIL:  The patient was brought to the operating room where general anesthetic was induced.  Preoperative antibiotics were administered.  Time-out was called.  Right shoulder was manipulated into full forward flexion, full external rotation and full abduction.  Some tearing of the tissue was palpable.  The patient did not have an excessively frozen and stiff shoulder, but she did have loss of full range of motion, which was corrected with manipulation and some tearing of the capsule.  Following this, the patient was placed in the beach- chair position with the head in neutral position.  Time-out was called prior to the manipulation.  Shoulder was then prescrubbed with alcohol and Betadine, allowed to air dry, prepped with DuraPrep solution and draped in a sterile manner.  Collier Flowersoban was used to cover the operative  field including the axilla.  Solution of saline and epinephrine injected into the subacromial space.  Posterior portal created 2 cm medial and inferior to the posterolateral margin of the acromion.  Diagnostic arthroscopy was performed.  The patient did have a small 5 x 5-mm full- thickness rotator cuff tear just posterior to the supraspinatus attachment.  The biceps anchor was intact.  Anterior-inferior and posterior-inferior ligaments were intact.  There were some redness and inflammation of the rotator interval.  Rotator cuff tear was debrided. Full-thickness component was visualized at the attachment site. Following this, debridement through a portal which was created in line with distal end of the clavicle.  The scope was placed in the subacromial space.  Lateral portal was created.  Bursectomy performed. Arthroscopic distal clavicle excision of the distal 9 mm of the clavicle was performed.  Following this, the decision was made to repair the rotator cuff.  I believe that, that was potentially the portal, through which, joint fluid was exiting and potentially creating this frozen shoulder situation.  Incision was made off the anterolateral margin of the acromion, deltoid split measured distance of 4 cm.  Split did not extend further than this.  Bursectomy completed.  Subacromial decompression with acromioplasty performed with the rasp.  Rotator cuff tear identified.  Single anchor was then placed after preparing the footprint.  The 5.5 corkscrew was placed fall and this was tied with suture tape and then tagged into the humeral head with two PushLocks. Good repair was achieved.  The distal clavicle excision was palpated and found to be adequate.  At this time, the superior and posterior ligaments were maintained followed by distal clavicle excision.  The joint was thoroughly irrigated.  Deltoid split closed using 0 Vicryl suture followed by interrupted inverted 2-0 Vicryl suture  and then 3-0 Monocryl.  Impervious dressings were placed.  The patient tolerated the procedure well without immediate complication, transferred to the recovery room in stable condition.     Burnard Bunting, M.D.     GSD/MEDQ  D:  03/04/2016  T:  03/05/2016  Job:  [id number]

## 2016-03-11 ENCOUNTER — Inpatient Hospital Stay (INDEPENDENT_AMBULATORY_CARE_PROVIDER_SITE_OTHER): Payer: Managed Care, Other (non HMO) | Admitting: Orthopedic Surgery

## 2016-03-12 ENCOUNTER — Inpatient Hospital Stay (INDEPENDENT_AMBULATORY_CARE_PROVIDER_SITE_OTHER): Payer: Managed Care, Other (non HMO) | Admitting: Orthopedic Surgery

## 2016-03-15 ENCOUNTER — Ambulatory Visit (INDEPENDENT_AMBULATORY_CARE_PROVIDER_SITE_OTHER): Payer: Self-pay | Admitting: Orthopedic Surgery

## 2016-03-15 ENCOUNTER — Encounter (INDEPENDENT_AMBULATORY_CARE_PROVIDER_SITE_OTHER): Payer: Self-pay | Admitting: Orthopedic Surgery

## 2016-03-15 DIAGNOSIS — M75111 Incomplete rotator cuff tear or rupture of right shoulder, not specified as traumatic: Secondary | ICD-10-CM

## 2016-03-15 MED ORDER — DOCUSATE SODIUM 50 MG PO CAPS: 100.0000 mg | ORAL_CAPSULE | Freq: Two times a day (BID) | ORAL | 0 refills | Status: DC

## 2016-03-15 MED ORDER — HYDROCODONE-ACETAMINOPHEN 5-325 MG PO TABS: 1.0000 | ORAL_TABLET | Freq: Four times a day (QID) | ORAL | 0 refills | Status: DC | PRN

## 2016-03-15 NOTE — Progress Notes (Signed)
   Post-Op Visit Note   Patient: Nicole PolioLucy Gray           Date of Birth: 1960/04/19           MRN: [id number] Visit Date: 03/15/2016 PCP: No PCP Per Patient   Assessment & Plan:  Chief Complaint:  Chief Complaint  Patient presents with  . Right Shoulder - Routine Post Op   Visit Diagnoses:  1. Incomplete tear of right rotator cuff     Plan: Nicole Gray is a 3457 outpatient postop right shoulder manipulation distal clavicle excision and mini open rotator cuff tear repair she's been in a CPM machine.  On exam she has reasonable passive range of motion but like her to be more over 90 of forward flexion and abduction and she is.  Cuff repair feels intact.  She's had some issues with constipation using the pain medicine.  Sutures discontinued today.  I want her to continue physical therapy continue with the CPM machine we will add Colace to her medical regimen change her from oxycodone to hydrocodone see her back in 3 weeks  Follow-Up Instructions: Return in about 3 weeks (around 04/05/2016).   Orders:  No orders of the defined types were placed in this encounter.  Meds ordered this encounter  Medications  . HYDROcodone-acetaminophen (NORCO/VICODIN) 5-325 MG tablet    Sig: Take 1 tablet by mouth every 6 (six) hours as needed for moderate pain.    Dispense:  45 tablet    Refill:  0  . docusate sodium (COLACE) 50 MG capsule    Sig: Take 2 capsules (100 mg total) by mouth 2 (two) times daily.    Dispense:  40 capsule    Refill:  0    Imaging: No results found.  PMFS History: Patient Active Problem List   Diagnosis Date Noted  . Rotator cuff tear 01/06/2016  . Right shoulder pain 01/01/2016   Past Medical History:  Diagnosis Date  . Frozen shoulder   . Headache    migraines  . Osteoarthritis    AC  . Panic attacks   . PONV (postoperative nausea and vomiting)    " I had a  mild panic attack coming out of anesthesia"  . Wears glasses     Family History  Problem Relation Age  of Onset  . Cancer Mother     breast  . Hypertension Mother     Past Surgical History:  Procedure Laterality Date  . COLONOSCOPY WITH ESOPHAGOGASTRODUODENOSCOPY (EGD)    . CYST EXCISION    . TONSILLECTOMY     Social History   Occupational History  . Not on file.   Social History Main Topics  . Smoking status: Never Smoker  . Smokeless tobacco: Never Used  . Alcohol use Yes     Comment: occasional  . Drug use: No  . Sexual activity: Not on file

## 2016-03-25 ENCOUNTER — Ambulatory Visit (INDEPENDENT_AMBULATORY_CARE_PROVIDER_SITE_OTHER): Payer: Managed Care, Other (non HMO) | Admitting: Rehabilitative and Restorative Service Providers"

## 2016-03-25 ENCOUNTER — Encounter: Payer: Self-pay | Admitting: Rehabilitative and Restorative Service Providers"

## 2016-03-25 DIAGNOSIS — M25611 Stiffness of right shoulder, not elsewhere classified: Secondary | ICD-10-CM | POA: Diagnosis not present

## 2016-03-25 DIAGNOSIS — M6281 Muscle weakness (generalized): Secondary | ICD-10-CM | POA: Diagnosis not present

## 2016-03-25 DIAGNOSIS — G8929 Other chronic pain: Secondary | ICD-10-CM

## 2016-03-25 DIAGNOSIS — R293 Abnormal posture: Secondary | ICD-10-CM

## 2016-03-25 DIAGNOSIS — M25511 Pain in right shoulder: Secondary | ICD-10-CM

## 2016-03-25 NOTE — Therapy (Signed)
[facility] [place] [address] [place], [place], [address] Phone: [phone]   Fax:  [phone]  Physical Therapy Evaluation  Patient Details  Name: Syesha Thaw MRN: [id number] Date of Birth: [birth year] Referring Provider: Dr Sherryl Barters   Encounter Date: 03/25/2016      PT End of Session - 03/25/16 1533    Visit Number 1   Number of Visits 12   Date for PT Re-Evaluation 05/06/16   PT Start Time 1534   PT Stop Time 1632   PT Time Calculation (min) 58 min   Activity Tolerance Patient tolerated treatment well      Past Medical History:  Diagnosis Date  . Frozen shoulder   . Headache    migraines  . Osteoarthritis    AC  . Panic attacks   . PONV (postoperative nausea and vomiting)    " I had a  mild panic attack coming out of anesthesia"  . Wears glasses     Past Surgical History:  Procedure Laterality Date  . COLONOSCOPY WITH ESOPHAGOGASTRODUODENOSCOPY (EGD)    . CYST EXCISION    . TONSILLECTOMY      There were no vitals filed for this visit.       Subjective Assessment - 03/25/16 1536    Subjective Patient reports pain and aching in the Rt shoudler over the past year with symptoms of frozen shoudler. She fell ~ 1 year ago and was on crutches for ~ 6 weeks. She went to PT three times and had injections in the shoudler. She had a rotator cuff tear in addition to frozen shoudler. She also had bone spurs. She wsa seen for MRI and subsequent surgery 03/04/16. she has done fairly well since surgery.    Pertinent History Lt hip pain on an intermittent basis; denies any medical problems    How long can you sit comfortably? 2 hours    Diagnostic tests MRI   Patient Stated Goals use Rt UE for normal functional activities; get rid of pain    Currently in Pain? Yes   Pain Score 5    Pain Location Shoulder   Pain Orientation Right;Anterior;Posterior;Proximal   Pain Descriptors / Indicators Sore   Pain Type  Surgical pain   Pain Radiating Towards into Rt side of the neck    Pain Onset More than a month ago   Pain Frequency Constant   Aggravating Factors  lifting arm; lying on the Rt side; hurts first thing in the morning; any movement    Pain Relieving Factors not moving arm; ice; meds            [facility] PT Assessment - 03/25/16 0001      Assessment   Medical Diagnosis Rt frozed shoulder/shoulder dysfunction    Referring Provider Dr Sherryl Barters    Onset Date/Surgical Date 03/04/16  pain for ~ 1 year pioir ot surgery    Hand Dominance Right   Next MD Visit 04/14/16     Precautions   Precautions None     Balance Screen   Has the patient fallen in the past 6 months No   Has the patient had a decrease in activity level because of a fear of falling?  No   Is the patient reluctant to leave their home because of a fear of falling?  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 released by MD    Leisure household chores; Pharmacologistlaundry; movines and time with daughter      Observation/Other Assessments   Focus on Therapeutic Outcomes (FOTO)  62% limitation      Sensation   Additional Comments WFL's per pt report      Posture/Postural Control   Posture Comments head forward; shoudlers rounded and elevated; incresaed thoracic kyphosis; holds Rt UE stiffly at side      AROM   Right Shoulder Extension 58 Degrees   Right Shoulder Flexion 106 Degrees   Right Shoulder ABduction 92 Degrees   Right Shoulder Internal Rotation 36 Degrees   Right Shoulder External Rotation 61 Degrees   Left Shoulder Extension 61 Degrees   Left Shoulder Flexion 160 Degrees   Left Shoulder ABduction 158 Degrees   Left Shoulder Internal Rotation 40 Degrees   Left  Shoulder External Rotation 100 Degrees   Cervical Flexion 59   Cervical Extension 33   Cervical - Right Side Bend 36   Cervical - Left Side Bend 33   Cervical - Right Rotation 63   Cervical - Left Rotation 65     Strength   Overall Strength Comments Rt UE not tested moves against gravity; Lt UE WFL's all muscle groups      Palpation   Palpation comment significant tenderness to palpation through Rt upper quarter including ant/lat/post cervical musculatyure; upper traps; leveator; pecs; deltoid; biceps                    [facility] Adult PT Treatment/Exercise - 03/25/16 0001      Neuro Re-ed    Neuro Re-ed Details  worked on posture and alignment engaging posterior shoudler girdle musculature      Shoulder Exercises: Standing   Other Standing Exercises scap squeeze 10 sec x 10      Shoulder Exercises: Stretch   External Rotation Stretch 10 seconds  10 reps standing w/noodle elbow at 90 deg cane ER    Table Stretch - Flexion 10 seconds  10 reps    Other Shoulder Stretches pendulum 30 CW/30CCW     Cryotherapy   Number Minutes Cryotherapy 15 Minutes   Cryotherapy Location Shoulder  Rt   Type of Cryotherapy Ice pack     Electrical Stimulation   Electrical Stimulation Location Rt shoudler girdle    Electrical Stimulation Action IFC   Electrical Stimulation Parameters to tolerance   Electrical Stimulation Goals Pain;Tone                PT Education - 03/25/16 1612    Education provided Yes   Education Details HEP; TENS   Person(s) Educated Patient   Methods Explanation;Demonstration;Tactile cues;Verbal cues;Handout   Comprehension Verbalized understanding;Returned demonstration;Verbal cues required;Tactile cues required             PT Long Term Goals - 03/25/16 1802      PT LONG TERM GOAL #1   Title Improve posture and alignment with patient to demo improved thoracic extension with posterior shoulder girdle musculature engaged 05/06/16   Time 6    Period Weeks   Status New     PT LONG TERM GOAL #2   Title Increase AROM Rt shoudler to without 5-10 degrees of AROM Lt shoudler 05/06/16   Time 6   Period Weeks  Status New     PT LONG TERM GOAL #3   Title Patient reports decreased pain in the Rt shoudler to intermitent pain with intensity decreased by 50% 05/06/16   Time 6   Period Weeks   Status New     PT LONG TERM GOAL #4   Title Independent in HEP 05/06/16   Time 6   Period Weeks   Status New     PT LONG TERM GOAL #5   Title Improve FOTO to </= 36% limitation 05/06/16   Time 6   Period Weeks   Status New               Plan - 03/25/16 1757    Clinical Impression Statement Patient presents s/p Rt shoudler MUA/DCR/SAD 03/04/16. She has a history of frozen shoudler and shoudler pain over the past year. Kasen presents with poor posture and alignment; limited ROM and mobility Rt shoudler and cervical spine; limited and decreased AROM and strength due to pain and surgery; significant sensitivity to light pressure/palpation Rt upper quadrant; muscular tightness; pain on a constant basis; limited functional activity level. Patient will benefit form PT to address problems identified.    Rehab Potential Good   PT Frequency 2x / week   PT Duration 6 weeks   PT Treatment/Interventions Patient/family education;ADLs/Self Care Home Management;Cryotherapy;Electrical Stimulation;Iontophoresis 4mg /ml Dexamethasone;Moist Heat;Ultrasound;Dry needling;Manual techniques;Therapeutic activities;Therapeutic exercise   PT Next Visit Plan progress with ROM and stabilization; neuromuscular re-education; progress with strengthening as indicated; modalities and manual work as indicated   Financial planner with Plan of Care Patient      Patient will benefit from skilled therapeutic intervention in order to improve the following deficits and impairments:  Postural dysfunction, Improper body mechanics, Pain, Increased fascial restricitons, Increased  muscle spasms, Decreased strength, Impaired UE functional use, Decreased range of motion, Decreased mobility, Decreased activity tolerance  Visit Diagnosis: Chronic right shoulder pain - Plan: PT plan of care cert/re-cert  Stiffness of right shoulder, not elsewhere classified - Plan: PT plan of care cert/re-cert  Muscle weakness (generalized) - Plan: PT plan of care cert/re-cert  Abnormal posture - Plan: PT plan of care cert/re-cert     Problem List Patient Active Problem List   Diagnosis Date Noted  . Rotator cuff tear 01/06/2016  . Right shoulder pain 01/01/2016    Shaden Higley Rober Minion PT, MPH  03/25/2016, 6:10 PM  [facility] [address] [place], [place], [address] Phone: [phone]   Fax:  [phone]  Name: Sherra Kimmons MRN: [id number] Date of Birth: Jun 11, 1960

## 2016-03-25 NOTE — Patient Instructions (Addendum)
Shoulder Blade Squeeze    Rotate shoulders back, then squeeze shoulder blades down and back. Hold 10 sec Repeat _10___ times. Do _2-3___ sessions per day.   ROM: External / Internal Rotation - Wand    Holding wand with left hand palm up, push out from body with other hand, palm down. Keep both elbows bent. When stretch is felt, hold __10__ seconds. Repeat to other side, leading with same hand. Keep elbows bent. Repeat __10__ times per set. Do __2-3__ sessions per day.   Pendulum Circular    Bend forward 90 at waist, leaning on table for support. Rock body in a circular pattern to move arm clockwise __30__ times then counterclockwise _30___ times. Do _3-4___ sessions per day.    ROM: Flexion    Keeping left arm on table, slide body away until stretch is felt. Hold __10__ seconds. Repeat _10___ times per set. Do _2-3___ sessions per day.  TENS UNIT: This is helpful for muscle pain and spasm.   Search and Purchase a TENS 7000 2nd edition at www.tenspros.com. It should be less than $30.     TENS unit instructions: Do not shower or bathe with the unit on Turn the unit off before removing electrodes or batteries If the electrodes lose stickiness add a drop of water to the electrodes after they are disconnected from the unit and place on plastic sheet. If you continued to have difficulty, call the TENS unit company to purchase more electrodes. Do not apply lotion on the skin area prior to use. Make sure the skin is clean and dry as this will help prolong the life of the electrodes. After use, always check skin for unusual red areas, rash or other skin difficulties. If there are any skin problems, does not apply electrodes to the same area. Never remove the electrodes from the unit by pulling the wires. Do not use the TENS unit or electrodes other than as directed. Do not change electrode placement without consultating your therapist or physician. Keep 2 fingers with between  each electrode.

## 2016-04-02 ENCOUNTER — Encounter: Payer: Managed Care, Other (non HMO) | Admitting: Physical Therapy

## 2016-04-05 ENCOUNTER — Ambulatory Visit (INDEPENDENT_AMBULATORY_CARE_PROVIDER_SITE_OTHER): Payer: Managed Care, Other (non HMO) | Admitting: Orthopedic Surgery

## 2016-04-05 DIAGNOSIS — M7501 Adhesive capsulitis of right shoulder: Secondary | ICD-10-CM

## 2016-04-05 DIAGNOSIS — M19019 Primary osteoarthritis, unspecified shoulder: Secondary | ICD-10-CM

## 2016-04-06 ENCOUNTER — Ambulatory Visit (INDEPENDENT_AMBULATORY_CARE_PROVIDER_SITE_OTHER): Payer: Managed Care, Other (non HMO) | Admitting: Rehabilitative and Restorative Service Providers"

## 2016-04-06 DIAGNOSIS — M6281 Muscle weakness (generalized): Secondary | ICD-10-CM

## 2016-04-06 DIAGNOSIS — G8929 Other chronic pain: Secondary | ICD-10-CM

## 2016-04-06 DIAGNOSIS — R293 Abnormal posture: Secondary | ICD-10-CM

## 2016-04-06 DIAGNOSIS — M25511 Pain in right shoulder: Secondary | ICD-10-CM

## 2016-04-06 DIAGNOSIS — M25611 Stiffness of right shoulder, not elsewhere classified: Secondary | ICD-10-CM | POA: Diagnosis not present

## 2016-04-06 NOTE — Therapy (Signed)
[facility] [place] [address] [place], [place], [address] Phone: [phone]   Fax:  [phone]  Physical Therapy Treatment  Patient Details  Name: Nena PolioLucy Hottel MRN: [id number] Date of Birth: [birth year] Referring Provider: Dr Sherryl BartersScott Greggory Dean  Encounter Date: 04/06/2016      PT End of Session - 04/06/16 1056    Visit Number 2   Number of Visits 12   Date for PT Re-Evaluation 05/06/16   PT Start Time 1041  appt at 10:15    PT Stop Time 1122   PT Time Calculation (min) 41 min   Activity Tolerance Patient tolerated treatment well      Past Medical History:  Diagnosis Date  . Frozen shoulder   . Headache    migraines  . Osteoarthritis    AC  . Panic attacks   . PONV (postoperative nausea and vomiting)    " I had a  mild panic attack coming out of anesthesia"  . Wears glasses     Past Surgical History:  Procedure Laterality Date  . COLONOSCOPY WITH ESOPHAGOGASTRODUODENOSCOPY (EGD)    . CYST EXCISION    . TONSILLECTOMY      There were no vitals filed for this visit.      Subjective Assessment - 04/06/16 1054    Subjective Out of town last week helping her daughter who has had surgery. Did some of her exercises while she was away. has experienced increased pain in the shoulder since she returned home. Pain and soreness in the front and top of the Rt shoudler.    Currently in Pain? Yes   Pain Score 6    Pain Location Shoulder   Pain Orientation Right;Anterior;Proximal   Pain Descriptors / Indicators Sore   Pain Type Surgical pain   Pain Onset More than a month ago   Pain Frequency Constant            [facility] PT Assessment - 04/06/16 0001      Assessment   Medical Diagnosis Rt frozed shoulder/shoulder dysfunction    Referring Provider Dr Sherryl BartersScott Greggory Dean   Onset Date/Surgical Date 03/04/16  pain for ~ 1 year pioir ot surgery    Hand Dominance Right   Next MD Visit 04/14/16     AROM   Right  Shoulder Extension 50 Degrees   Right Shoulder Flexion 120 Degrees   Right Shoulder ABduction 116 Degrees   Right Shoulder Internal Rotation 33 Degrees   Right Shoulder External Rotation 57 Degrees                     [facility] Adult PT Treatment/Exercise - 04/06/16 0001      Shoulder Exercises: Supine   Other Supine Exercises AAROM Rt shoudler flexion assisting with Lt UE 10 sec x 10    Other Supine Exercises scap squeeze in supine      Cryotherapy   Number Minutes Cryotherapy 15 Minutes   Cryotherapy Location Shoulder  Rt   Type of Cryotherapy Ice pack     Electrical Stimulation   Electrical Stimulation Location Rt shoudler girdle    Electrical Stimulation Action IFC   Electrical Stimulation Parameters to tolerance   Electrical Stimulation Goals Pain;Tone     Manual Therapy   Manual therapy comments pt supine    Soft tissue mobilization Rt shoudler through the pecs/upper trap   Scapular Mobilization gentle Rt scap  PT Education - 04/06/16 1100    Education provided Yes   Education Details HEP    Person(s) Educated Patient   Methods Explanation;Demonstration;Tactile cues;Verbal cues;Handout   Comprehension Verbalized understanding;Returned demonstration;Verbal cues required;Tactile cues required             PT Long Term Goals - 03/25/16 1802      PT LONG TERM GOAL #1   Title Improve posture and alignment with patient to demo improved thoracic extension with posterior shoulder girdle musculature engaged 05/06/16   Time 6   Period Weeks   Status New     PT LONG TERM GOAL #2   Title Increase AROM Rt shoudler to without 5-10 degrees of AROM Lt shoudler 05/06/16   Time 6   Period Weeks   Status New     PT LONG TERM GOAL #3   Title Patient reports decreased pain in the Rt shoudler to intermitent pain with intensity decreased by 50% 05/06/16   Time 6   Period Weeks   Status New     PT LONG TERM GOAL #4   Title Independent in  HEP 05/06/16   Time 6   Period Weeks   Status New     PT LONG TERM GOAL #5   Title Improve FOTO to </= 36% limitation 05/06/16   Time 6   Period Weeks   Status New             Patient will benefit from skilled therapeutic intervention in order to improve the following deficits and impairments:     Visit Diagnosis: Chronic right shoulder pain  Stiffness of right shoulder, not elsewhere classified  Muscle weakness (generalized)  Abnormal posture     Problem List Patient Active Problem List   Diagnosis Date Noted  . Acromioclavicular joint arthritis   . Adhesive capsulitis of right shoulder   . Rotator cuff tear 01/06/2016  . Right shoulder pain 01/01/2016    Alverna Fawley Rober Minion PT, MPH  04/06/2016, 12:59 PM  [facility] [address] [place], [place], [address] Phone: [phone]   Fax:  [phone]  Name: Jewell Haught MRN: [id number] Date of Birth: 1960-09-11

## 2016-04-06 NOTE — Patient Instructions (Signed)
Lying on back Hold right wrist with left hand  Use left arm to raise right arm over head Hold 10 sec 10 reps 2-3 times/day  Lying on back  Squeeze shoulder blades tight  Hold 10 sec 10 reps 2-3 times per day

## 2016-04-08 ENCOUNTER — Ambulatory Visit (INDEPENDENT_AMBULATORY_CARE_PROVIDER_SITE_OTHER): Payer: Managed Care, Other (non HMO) | Admitting: Physical Therapy

## 2016-04-08 DIAGNOSIS — M25511 Pain in right shoulder: Secondary | ICD-10-CM

## 2016-04-08 DIAGNOSIS — M6281 Muscle weakness (generalized): Secondary | ICD-10-CM | POA: Diagnosis not present

## 2016-04-08 DIAGNOSIS — M25611 Stiffness of right shoulder, not elsewhere classified: Secondary | ICD-10-CM | POA: Diagnosis not present

## 2016-04-08 DIAGNOSIS — G8929 Other chronic pain: Secondary | ICD-10-CM

## 2016-04-08 DIAGNOSIS — R293 Abnormal posture: Secondary | ICD-10-CM

## 2016-04-08 NOTE — Therapy (Signed)
[place], [place], [address] Phone: [phone]   Fax:  [phone]  Physical Therapy Treatment  Patient Details  Name: Nicole Gray MRN: [id number] Date of Birth: [birth year] Referring Provider: Dr. Berna Spare  Encounter Date: 04/08/2016      PT End of Session - 04/08/16 1449    Visit Number 3   Number of Visits 12   Date for PT Re-Evaluation 05/06/16   PT Start Time 6295   PT Stop Time 1502   PT Time Calculation (min) 57 min   Activity Tolerance Patient tolerated treatment well   Behavior During Therapy [facility] for tasks assessed/performed      Past Medical History:  Diagnosis Date  . Frozen shoulder   . Headache    migraines  . Osteoarthritis    AC  . Panic attacks   . PONV (postoperative nausea and vomiting)    " I had a  mild panic attack coming out of anesthesia"  . Wears glasses     Past Surgical History:  Procedure Laterality Date  . COLONOSCOPY WITH ESOPHAGOGASTRODUODENOSCOPY (EGD)    . CYST EXCISION    . TONSILLECTOMY      There were no vitals filed for this visit.      Subjective Assessment - 04/08/16 1420    Subjective Pt reports her Rt shoulder feels very sensitive today.     Currently in Pain? Yes   Pain Score 6    Pain Location Shoulder   Pain Orientation Right   Aggravating Factors  lying on Rt side, lifting arm    Pain Relieving Factors medicine, ice, heat            [facility] PT Assessment - 04/08/16 0001      Assessment   Medical Diagnosis Rt frozed shoulder/shoulder dysfunction    Referring Provider Dr. Berna Spare   Onset Date/Surgical Date 03/04/16   Hand Dominance Right   Next MD Visit 04/14/16          [facility] Adult PT Treatment/Exercise - 04/08/16 0001      Self-Care   Self-Care Other Self-Care Comments   Other Self-Care Comments  Pt educated on self massage to Rt shoulder girdle with ball, massage to incision.  Pt  verbalized understanding and returned demo.       Shoulder Exercises: Supine   External Rotation AAROM;Right;10 reps  cane   Flexion Both;10 reps;AAROM  cane    Other Supine Exercises AAROM bench press with cane x 10 reps    Other Supine Exercises scap squeeze in supine      Shoulder Exercises: Pulleys   Flexion --  10 times, 10 sec hold.    ABduction --  10 times, 10 sec hold.      Modalities   Modalities Electrical Stimulation;Cryotherapy;Ultrasound     Cryotherapy   Number Minutes Cryotherapy 15 Minutes   Cryotherapy Location Shoulder  Rt   Type of Cryotherapy Ice pack     Electrical Stimulation   Electrical Stimulation Location Rt Shoulder   Electrical Stimulation Action IFC   Electrical Stimulation Parameters to tolerance    Electrical Stimulation Goals Tone;Pain     Ultrasound   Ultrasound Location Rt lateral/ant shoulder @ incision site    Ultrasound Parameters 50%, 0.9w/cm2, 8 min    Ultrasound Goals --  tone, to smooth incision.     Manual Therapy   Manual therapy comments 2 power strips of Rock tape applied over Rt  ant shoulder for scar management, reduced edema, and to decrease pain.  (75% stretch in center of tape)                PT Education - 04/08/16 1452    Education provided Yes   Education Details HEP - added cane AAROM   Person(s) Educated Patient   Methods Explanation;Handout   Comprehension Verbalized understanding             PT Long Term Goals - 04/08/16 1451      PT LONG TERM GOAL #1   Title Improve posture and alignment with patient to demo improved thoracic extension with posterior shoulder girdle musculature engaged 05/06/16   Time 6   Period Weeks   Status On-going     PT LONG TERM GOAL #2   Title Increase AROM Rt shoulder to without 5-10 degrees of AROM Lt shoudler 05/06/16   Time 6   Period Weeks   Status On-going     PT LONG TERM GOAL #3   Title Patient reports decreased pain in the Rt shoulder to intermitent  pain with intensity decreased by 50% 05/06/16   Time 6   Period Weeks   Status On-going     PT LONG TERM GOAL #4   Title Independent in HEP 05/06/16   Time 6   Period Weeks   Status On-going     PT LONG TERM GOAL #5   Title Improve FOTO to </= 36% limitation 05/06/16   Time 6   Period Weeks   Status On-going               Plan - 04/08/16 1450    Clinical Impression Statement Pt tolerated all exercises well, with slight increase in pain with stretches of Rt shoulder (AAROM).  She is very sensitive to touch around incision on her Rt shoulder.  Pt reported decreased pain with use of ice/estim at end of session.  No goals met yet.    Rehab Potential Good   PT Frequency 2x / week   PT Duration 6 weeks   PT Treatment/Interventions Patient/family education;ADLs/Self Care Home Management;Cryotherapy;Electrical Stimulation;Iontophoresis 86m/ml Dexamethasone;Moist Heat;Ultrasound;Dry needling;Manual techniques;Therapeutic activities;Therapeutic exercise   PT Next Visit Plan progress with ROM and stabilization; neuromuscular re-education; progress with strengthening as indicated; modalities and manual work as indicated   COncologistwith Plan of Care Patient      Patient will benefit from skilled therapeutic intervention in order to improve the following deficits and impairments:  Postural dysfunction, Improper body mechanics, Pain, Increased fascial restricitons, Increased muscle spasms, Decreased strength, Impaired UE functional use, Decreased range of motion, Decreased mobility, Decreased activity tolerance  Visit Diagnosis: Chronic right shoulder pain  Stiffness of right shoulder, not elsewhere classified  Muscle weakness (generalized)  Abnormal posture     Problem List Patient Active Problem List   Diagnosis Date Noted  . Acromioclavicular joint arthritis   . Adhesive capsulitis of right shoulder   . Rotator cuff tear 01/06/2016  . Right shoulder pain  01/01/2016   JKerin Perna PTA 04/08/16 2:57 PM  [place] [address]Phone: [phone]  Fax:  [phone] Name: Nicole RoordaMRN: [id number] of Birth: 1Feb 07, 1962

## 2016-04-08 NOTE — Patient Instructions (Signed)
SHOULDER: External Rotation - Supine (Cane)    Hold cane with both hands. Rotate arm away from body. Keep elbow on floor and next to body. _10__ reps per set, _2__ sets per day, __5_ days per week Add towel to keep elbow at side.  Copyright  VHI. All rights reserved.  Cane Exercise: Flexion    Lie on back, holding cane above chest. Keeping arms as straight as possible, lower cane toward floor beyond head. Hold ___5_ seconds. Repeat _10_ times. Do __2__ sessions per day.   [facility]  [address] [place], [place] [address]  [phone] (office) [phone] (fax)

## 2016-04-12 ENCOUNTER — Encounter: Payer: Managed Care, Other (non HMO) | Admitting: Rehabilitative and Restorative Service Providers"

## 2016-04-15 ENCOUNTER — Ambulatory Visit (INDEPENDENT_AMBULATORY_CARE_PROVIDER_SITE_OTHER): Payer: Self-pay | Admitting: Orthopedic Surgery

## 2016-04-15 ENCOUNTER — Ambulatory Visit (INDEPENDENT_AMBULATORY_CARE_PROVIDER_SITE_OTHER): Payer: Managed Care, Other (non HMO) | Admitting: Physical Therapy

## 2016-04-15 ENCOUNTER — Encounter (INDEPENDENT_AMBULATORY_CARE_PROVIDER_SITE_OTHER): Payer: Self-pay | Admitting: Orthopedic Surgery

## 2016-04-15 DIAGNOSIS — G8929 Other chronic pain: Secondary | ICD-10-CM | POA: Diagnosis not present

## 2016-04-15 DIAGNOSIS — M25611 Stiffness of right shoulder, not elsewhere classified: Secondary | ICD-10-CM | POA: Diagnosis not present

## 2016-04-15 DIAGNOSIS — M25511 Pain in right shoulder: Secondary | ICD-10-CM | POA: Diagnosis not present

## 2016-04-15 DIAGNOSIS — M7501 Adhesive capsulitis of right shoulder: Secondary | ICD-10-CM

## 2016-04-15 MED ORDER — HYDROCODONE-ACETAMINOPHEN 5-325 MG PO TABS: 1.0000 | ORAL_TABLET | Freq: Two times a day (BID) | ORAL | 0 refills | Status: DC | PRN

## 2016-04-15 MED ORDER — MELOXICAM 15 MG PO TABS: 15.0000 mg | ORAL_TABLET | Freq: Every day | ORAL | 0 refills | Status: DC

## 2016-04-15 NOTE — Progress Notes (Signed)
   Post-Op Visit Note   Patient: Nicole Gray           Date of Birth: May 16, 1960           MRN: [id number] Visit Date: 04/15/2016 PCP: No PCP Per Patient   Assessment & Plan:  Chief Complaint:  Chief Complaint  Patient presents with  . Right Shoulder - Pain   Visit Diagnoses:  1. Adhesive capsulitis of right shoulder     Plan: We see Nicole Gray is a 56 year old patient is now 6 weeks out right shoulder manipulation distal clavicle excision rotator cuff repair CPM machine is at 30 and 90 she's taking mobile and Norco.  She's been to physical therapy 4.  She's doing home exercise program she's also purchased a TENS unit.  Plan at this time is to refill mobility and Norco follow-up in 6 weeks.  Continue with physical therapy range of motion exercises.  Okay to start strengthening.  Follow-Up Instructions: Return in about 6 weeks (around 05/27/2016).   Orders:  No orders of the defined types were placed in this encounter.  Meds ordered this encounter  Medications  . HYDROcodone-acetaminophen (NORCO/VICODIN) 5-325 MG tablet    Sig: Take 1 tablet by mouth every 12 (twelve) hours as needed for moderate pain.    Dispense:  45 tablet    Refill:  0  . meloxicam (MOBIC) 15 MG tablet    Sig: Take 1 tablet (15 mg total) by mouth daily.    Dispense:  30 tablet    Refill:  0    Imaging: No results found.  PMFS History: Patient Active Problem List   Diagnosis Date Noted  . Acromioclavicular joint arthritis   . Adhesive capsulitis of right shoulder   . Rotator cuff tear 01/06/2016  . Right shoulder pain 01/01/2016   Past Medical History:  Diagnosis Date  . Frozen shoulder   . Headache    migraines  . Osteoarthritis    AC  . Panic attacks   . PONV (postoperative nausea and vomiting)    " I had a  mild panic attack coming out of anesthesia"  . Wears glasses     Family History  Problem Relation Age of Onset  . Cancer Mother     breast  . Hypertension Mother     Past  Surgical History:  Procedure Laterality Date  . COLONOSCOPY WITH ESOPHAGOGASTRODUODENOSCOPY (EGD)    . CYST EXCISION    . TONSILLECTOMY     Social History   Occupational History  . Not on file.   Social History Main Topics  . Smoking status: Never Smoker  . Smokeless tobacco: Never Used  . Alcohol use Yes     Comment: occasional  . Drug use: No  . Sexual activity: Not on file

## 2016-04-15 NOTE — Therapy (Signed)
[facility] [address] [place], [place], [address] Phone: [phone]   Fax:  [phone]  Physical Therapy Treatment  Patient Details  Name: Nicole PolioLucy Gray MRN: [id number] Date of Birth: 04/06/1960 Referring Provider: Dr. Sherryl BartersScott Greggory Dean   Encounter Date: 04/15/2016      PT End of Session - 04/15/16 1018    Visit Number 4   Number of Visits 12   Date for PT Re-Evaluation 05/06/16   PT Start Time 1018   PT Stop Time 1114   PT Time Calculation (min) 56 min      Past Medical History:  Diagnosis Date  . Frozen shoulder   . Headache    migraines  . Osteoarthritis    AC  . Panic attacks   . PONV (postoperative nausea and vomiting)    " I had a  mild panic attack coming out of anesthesia"  . Wears glasses     Past Surgical History:  Procedure Laterality Date  . COLONOSCOPY WITH ESOPHAGOGASTRODUODENOSCOPY (EGD)    . CYST EXCISION    . TONSILLECTOMY      There were no vitals filed for this visit.      Subjective Assessment - 04/15/16 1022    Subjective Pt reports she likes the way the [facility] tape feels supportive; it has decreased the pain and sensitivity. She did some yard work with husband yesterday; lifting shovel to scoop poop up.  She said her Rt shoulder had increased soreness remainder of day and into evening.  She bought TENS unit and would like education on use.    Currently in Pain? Yes   Pain Score 6    Pain Location Shoulder   Pain Descriptors / Indicators Sore   Aggravating Factors  lifting arm, lying on Rt side.    Pain Relieving Factors medicine, ice             [facility] Assessment - 04/15/16 0001      Assessment   Medical Diagnosis Rt frozen shoulder/shoulder dysfunction    Referring Provider Dr. Sherryl BartersScott Greggory Dean    Onset Date/Surgical Date 03/04/16   Hand Dominance Right   Next MD Visit 04/15/16     AROM   Right Shoulder Extension 52 Degrees  standing   Right Shoulder  Flexion 142 Degrees  supine and standing   Right Shoulder ABduction 120 Degrees  scaption, in standing   Right Shoulder External Rotation 57 Degrees  supine, shoulder abdct 70 deg     Strength   Overall Strength Comments Rt UE not tested moves against gravity; Lt UE WFL's all muscle groups            [facility] Adult PT Treatment/Exercise - 04/15/16 0001      Self-Care   Self-Care Other Self-Care Comments   Other Self-Care Comments  Pt educated on use and application of TENS unit.  Pt verbalized understanding.      Shoulder Exercises: Supine   External Rotation AAROM;Right;10 reps  cane, held x 10 sec   Flexion Both;10 reps;AAROM  cane, x 10 sec hold    Other Supine Exercises AAROM bench press with cane x 6 reps    Other Supine Exercises scap squeeze in supine x 10 reps      Shoulder Exercises: Prone   Extension Right;10 reps  to neutral   Other Prone Exercises Rt prone row to neutral x 10 reps     Shoulder Exercises: Standing   Flexion AROM;Strengthening;Right;10 reps  to 90 deg, with scap squeeze against noodle   Other Standing Exercises scap squeeze 10 sec x 10      Shoulder Exercises: Pulleys   Flexion --  10 times, 10 sec hold.    ABduction --  10 times, 10 sec hold.      Cryotherapy   Number Minutes Cryotherapy 15 Minutes   Cryotherapy Location Shoulder  Rt   Type of Cryotherapy Ice pack     Electrical Stimulation   Electrical Stimulation Location Rt Shoulder   Electrical Stimulation Action IFC    Electrical Stimulation Parameters to tolerance    Electrical Stimulation Goals Pain     Ultrasound   Ultrasound Location Rt lateral/ant shoulder (incision site)   Ultrasound Parameters 50%, 0.9 w/cm2, 8 min    Ultrasound Goals --  tone, to smooth incision.                PT Education - 04/15/16 1049    Education provided Yes   Education Details TENS application and use.    Person(s) Educated Patient;Child(ren)   Methods  Explanation;Demonstration;Verbal cues   Comprehension Verbalized understanding             PT Long Term Goals - 04/08/16 1451      PT LONG TERM GOAL #1   Title Improve posture and alignment with patient to demo improved thoracic extension with posterior shoulder girdle musculature engaged 05/06/16   Time 6   Period Weeks   Status On-going     PT LONG TERM GOAL #2   Title Increase AROM Rt shoulder to without 5-10 degrees of AROM Lt shoudler 05/06/16   Time 6   Period Weeks   Status On-going     PT LONG TERM GOAL #3   Title Patient reports decreased pain in the Rt shoulder to intermitent pain with intensity decreased by 50% 05/06/16   Time 6   Period Weeks   Status On-going     PT LONG TERM GOAL #4   Title Independent in HEP 05/06/16   Time 6   Period Weeks   Status On-going     PT LONG TERM GOAL #5   Title Improve FOTO to </= 36% limitation 05/06/16   Time 6   Period Weeks   Status On-going               Plan - 04/15/16 1025    Clinical Impression Statement Pt is now 6 wks s/p Rt shoulder surgery.     Rehab Potential Good   PT Frequency 2x / week   PT Duration 6 weeks   PT Treatment/Interventions Patient/family education;ADLs/Self Care Home Management;Cryotherapy;Electrical Stimulation;Iontophoresis 4mg /ml Dexamethasone;Moist Heat;Ultrasound;Dry needling;Manual techniques;Therapeutic activities;Therapeutic exercise   PT Next Visit Plan progress with ROM and stabilization; neuromuscular re-education; progress with strengthening as indicated; modalities and manual work as indicated      Patient will benefit from skilled therapeutic intervention in order to improve the following deficits and impairments:  Postural dysfunction, Improper body mechanics, Pain, Increased fascial restricitons, Increased muscle spasms, Decreased strength, Impaired UE functional use, Decreased range of motion, Decreased mobility, Decreased activity tolerance  Visit Diagnosis: Chronic  right shoulder pain  Stiffness of right shoulder, not elsewhere classified     Problem List Patient Active Problem List   Diagnosis Date Noted  . Acromioclavicular joint arthritis   . Adhesive capsulitis of right shoulder   . Rotator cuff tear 01/06/2016  . Right shoulder pain 01/01/2016   Mayer Camel, PTA 04/15/16 12:50  PM  [facility] [address] [place], [place], [address] Phone: [phone]   Fax:  [phone]  Name: Anitha Kreiser MRN: [id number] Date of Birth: [birth year]

## 2016-04-21 ENCOUNTER — Ambulatory Visit (INDEPENDENT_AMBULATORY_CARE_PROVIDER_SITE_OTHER): Payer: Managed Care, Other (non HMO) | Admitting: Physical Therapy

## 2016-04-21 DIAGNOSIS — M25611 Stiffness of right shoulder, not elsewhere classified: Secondary | ICD-10-CM

## 2016-04-21 DIAGNOSIS — G8929 Other chronic pain: Secondary | ICD-10-CM

## 2016-04-21 DIAGNOSIS — R293 Abnormal posture: Secondary | ICD-10-CM

## 2016-04-21 DIAGNOSIS — M25511 Pain in right shoulder: Secondary | ICD-10-CM

## 2016-04-21 DIAGNOSIS — M6281 Muscle weakness (generalized): Secondary | ICD-10-CM

## 2016-04-21 NOTE — Patient Instructions (Addendum)
  ROM: Flexion (Standing)    Bring arms straight out in front and raise as high as possible without pain. Keep palms facing up. Repeat __10__ times per set. Do _1-2___ sets per session. Do __2__ sessions per day. .  ROM: Abduction (Standing)    Bring arm straight out from sides and raise as high as possible without pain. Repeat __10__ times per set. Do __1-2_ sets per session. Do __2__ sessions per day.   Low Row: Single Arm   Face anchor in stride stance. Palm up, pull arm back while squeezing shoulder blades together. Repeat 10__ times per set. Repeat with other arm. Do _2-3_ sets per session. Do 3__ sessions per week. Anchor Height: Waist  Rotation: Internal (Single Arm)   Side toward anchor in shoulder width stance with elbow bent to 90, forearm away from body. Thumb up, pull arm across body keeping elbow bent. Repeat _10_ times per set. Repeat with other arm. Do _2-3_ sets per session. Do _3_ sessions per week. Anchor Height: Waist   Progressive Resisted: External Rotation (Side-Lying)    Holding __1__ pound weight, towel under arm, raise right forearm toward ceiling. Keep elbow bent and at side. Repeat _10___ times per set. Do __2__ sets per session. Do _3-4___ sessions per week.   [facility] [address] [place], [place] [address]  [phone] (office) [phone] (fax)

## 2016-04-21 NOTE — Therapy (Signed)
[facility] [place] [address] [place], [place], [address] Phone: [phone]   Fax:  [phone]  Physical Therapy Treatment  Patient Details  Name: Nicole Gray MRN: [id number] Date of Birth: 09-05-1960 Referring Provider: Dr. Sherryl Barters   Encounter Date: 04/21/2016      PT End of Session - 04/21/16 1112    Visit Number 5   Number of Visits 12   Date for PT Re-Evaluation 05/06/16   PT Start Time 1104   PT Stop Time 1209   PT Time Calculation (min) 65 min   Activity Tolerance Patient tolerated treatment well   Behavior During Therapy [facility] for tasks assessed/performed      Past Medical History:  Diagnosis Date  . Frozen shoulder   . Headache    migraines  . Osteoarthritis    AC  . Panic attacks   . PONV (postoperative nausea and vomiting)    " I had a  mild panic attack coming out of anesthesia"  . Wears glasses     Past Surgical History:  Procedure Laterality Date  . COLONOSCOPY WITH ESOPHAGOGASTRODUODENOSCOPY (EGD)    . CYST EXCISION    . TONSILLECTOMY      There were no vitals filed for this visit.      Subjective Assessment - 04/21/16 1113    Subjective Nicole Gray states she had MD visit; he was pleased with progress and liked the effects from tape. She has an order to initiate strength training.  She is still taking Mobic and using TENS. She is trying to limit lifiting heavy items, but trying to get back to normal ADLs.     Currently in Pain? Yes   Pain Score 4   with Mobic 2 hours prior to treatment.    Pain Location Shoulder   Pain Orientation Right   Pain Descriptors / Indicators Sore   Pain Radiating Towards into Rt side of neck.   Aggravating Factors  lying on Rt side, lifting arm    Pain Relieving Factors medicine, ice, positioning arm.             [facility] PT Assessment - 04/21/16 0001      Assessment   Medical Diagnosis Rt frozen shoulder/shoulder dysfunction    Referring Provider  Dr. Sherryl Barters    Onset Date/Surgical Date 03/04/16   Hand Dominance Right          [facility] Adult PT Treatment/Exercise - 04/21/16 0001      Self-Care   Other Self-Care Comments  Pt re-educated on self massage to incision.  Pt verbalized understanding and returned demo.       Shoulder Exercises: Prone   Extension Right;10 reps  to neutral, tactile and VC for improved scapular movement      Shoulder Exercises: Sidelying   External Rotation Strengthening;Right;10 reps;Weights   External Rotation Weight (lbs) 1   External Rotation Limitations (improved form in this position compared to standing)     Shoulder Exercises: Standing   External Rotation Strengthening;Right;10 reps;Theraband   Theraband Level (Shoulder External Rotation) Level 1 (Yellow)  multiple cues for form; stopped and changed position   Internal Rotation Strengthening;Right;10 reps;Theraband   Theraband Level (Shoulder Internal Rotation) Level 1 (Yellow)   Flexion AROM;Strengthening;Right;10 reps  to 90 deg, with scap squeeze against noodle   ABduction AROM;Right;10 reps  scaption, VC for posture   Row Right;Theraband;12 reps;Strengthening   Theraband Level (Shoulder Row) Level 1 (Yellow)   Other Standing Exercises scap  squeeze 10 sec x 10      Shoulder Exercises: Pulleys   Flexion --  10 times, 10 sec hold.    ABduction --  10 times, 10 sec hold.      Cryotherapy   Number Minutes Cryotherapy 15 Minutes   Cryotherapy Location Shoulder  Rt   Type of Cryotherapy Ice pack     Electrical Stimulation   Electrical Stimulation Location Rt Shoulder    Electrical Stimulation Action IFC   Electrical Stimulation Parameters to tolerance   Electrical Stimulation Goals Pain     Manual Therapy   Manual therapy comments 2 power strips of Rock tape applied over Rt ant shoulder for scar management, reduced edema, and to decrease pain.  (75% stretch in center of tape)                PT Education -  04/21/16 1232    Education provided Yes   Education Details HEP    Person(s) Educated Patient   Methods Explanation;Demonstration;Tactile cues;Verbal cues;Handout   Comprehension Verbalized understanding;Returned demonstration             PT Long Term Goals - 04/08/16 1451      PT LONG TERM GOAL #1   Title Improve posture and alignment with patient to demo improved thoracic extension with posterior shoulder girdle musculature engaged 05/06/16   Time 6   Period Weeks   Status On-going     PT LONG TERM GOAL #2   Title Increase AROM Rt shoulder to without 5-10 degrees of AROM Lt shoudler 05/06/16   Time 6   Period Weeks   Status On-going     PT LONG TERM GOAL #3   Title Patient reports decreased pain in the Rt shoulder to intermitent pain with intensity decreased by 50% 05/06/16   Time 6   Period Weeks   Status On-going     PT LONG TERM GOAL #4   Title Independent in HEP 05/06/16   Time 6   Period Weeks   Status On-going     PT LONG TERM GOAL #5   Title Improve FOTO to </= 36% limitation 05/06/16   Time 6   Period Weeks   Status On-going               Plan - 04/21/16 1225    Clinical Impression Statement Pt had positive response to Rock tape application over incision on Rt shoulder; less puckering and sensitivity.  Pt required multiple cues for improved posture and scapular positioning with AROM and strengthening of Rt shoulder. She tolerated all exercises with minimal increase in pain.  Pt is making gradual gains towards established goals.    Rehab Potential Good   PT Frequency 2x / week   PT Duration 6 weeks   PT Treatment/Interventions Patient/family education;ADLs/Self Care Home Management;Cryotherapy;Electrical Stimulation;Iontophoresis 4mg /ml Dexamethasone;Moist Heat;Ultrasound;Dry needling;Manual techniques;Therapeutic activities;Therapeutic exercise   PT Next Visit Plan progress with Rt shoulder ROM, stabilization and strengthening.  Assess ROM and goals.     Consulted and Agree with Plan of Care Patient      Patient will benefit from skilled therapeutic intervention in order to improve the following deficits and impairments:  Postural dysfunction, Improper body mechanics, Pain, Increased fascial restricitons, Increased muscle spasms, Decreased strength, Impaired UE functional use, Decreased range of motion, Decreased mobility, Decreased activity tolerance  Visit Diagnosis: Chronic right shoulder pain  Stiffness of right shoulder, not elsewhere classified  Muscle weakness (generalized)  Abnormal posture  Problem List Patient Active Problem List   Diagnosis Date Noted  . Acromioclavicular joint arthritis   . Adhesive capsulitis of right shoulder   . Rotator cuff tear 01/06/2016  . Right shoulder pain 01/01/2016   Mayer CamelJennifer Carlson-Long, PTA 04/21/16 12:36 PM  [facility] [address] [place], [place], [address] Phone: [phone]   Fax:  [phone]  Name: Nicole PolioLucy Gray MRN: [id number] Date of Birth: August 30, 1960

## 2016-04-23 ENCOUNTER — Ambulatory Visit (INDEPENDENT_AMBULATORY_CARE_PROVIDER_SITE_OTHER): Payer: 59 | Admitting: Physical Therapy

## 2016-04-23 DIAGNOSIS — R293 Abnormal posture: Secondary | ICD-10-CM

## 2016-04-23 DIAGNOSIS — M6281 Muscle weakness (generalized): Secondary | ICD-10-CM

## 2016-04-23 DIAGNOSIS — M25611 Stiffness of right shoulder, not elsewhere classified: Secondary | ICD-10-CM

## 2016-04-23 DIAGNOSIS — M25511 Pain in right shoulder: Secondary | ICD-10-CM | POA: Diagnosis not present

## 2016-04-23 DIAGNOSIS — G8929 Other chronic pain: Secondary | ICD-10-CM | POA: Diagnosis not present

## 2016-04-23 NOTE — Therapy (Signed)
[facility] [address] [place], [place], [address] Phone: [phone]   Fax:  [phone]  Physical Therapy Treatment  Patient Details  Name: Nicole PolioLucy Gray MRN: [id number] Date of Birth: 01-Mar-1960 Referring Provider: Dr. Sherryl BartersScott Greggory Dean  Encounter Date: 04/23/2016      PT End of Session - 04/23/16 1416    Visit Number 6   Number of Visits 12   Date for PT Re-Evaluation 05/06/16   PT Start Time 1412  pt arrived late   PT Stop Time 1501   PT Time Calculation (min) 49 min   Activity Tolerance Patient tolerated treatment well   Behavior During Therapy [facility] for tasks assessed/performed      Past Medical History:  Diagnosis Date  . Frozen shoulder   . Headache    migraines  . Osteoarthritis    AC  . Panic attacks   . PONV (postoperative nausea and vomiting)    " I had a  mild panic attack coming out of anesthesia"  . Wears glasses     Past Surgical History:  Procedure Laterality Date  . COLONOSCOPY WITH ESOPHAGOGASTRODUODENOSCOPY (EGD)    . CYST EXCISION    . TONSILLECTOMY      There were no vitals filed for this visit.          [facility] PT Assessment - 04/23/16 0001      Assessment   Medical Diagnosis Rt frozen shoulder/shoulder dysfunction    Referring Provider Dr. Sherryl BartersScott Greggory Dean   Onset Date/Surgical Date 03/04/16   Hand Dominance Right   Next MD Visit 06/02/16     AROM   Right Shoulder Extension 35 Degrees  standing   Right Shoulder Flexion 132 Degrees  standing, with pain    Right Shoulder ABduction 110 Degrees  standing, with pain   Right Shoulder Internal Rotation 47 Degrees   Right Shoulder External Rotation 60 Degrees           [facility] Adult PT Treatment/Exercise - 04/23/16 0001      Shoulder Exercises: Standing   External Rotation Strengthening;Right;10 reps;Theraband   Theraband Level (Shoulder External Rotation) Level 1 (Yellow)  multiple cues for form; stopped and  changed position   Internal Rotation Strengthening;Right;10 reps;Theraband   Theraband Level (Shoulder Internal Rotation) Level 1 (Yellow)   Flexion AROM;Strengthening;Right;10 reps  to 90 deg, with scap squeeze against noodle   ABduction AROM;Right;10 reps  scaption, VC for posture   Row Right;Theraband;12 reps;Strengthening   Theraband Level (Shoulder Row) Level 1 (Yellow)   Other Standing Exercises wall ladder, RUE in flexion / scaption x 5 reps each .      Shoulder Exercises: ROM/Strengthening   Rebounder L1: 2 min forward  2 min backwards     Modalities   Modalities Electrical Stimulation;Moist Heat     Moist Heat Therapy   Number Minutes Moist Heat 15 Minutes   Moist Heat Location Shoulder     Electrical Stimulation   Electrical Stimulation Location Rt shoulder   Electrical Stimulation Action IFC   Electrical Stimulation Parameters To tolerance   Electrical Stimulation Goals Pain                PT Education - 04/23/16 1445    Education provided Yes   Education Details HEP - added standing resisted ER   Person(s) Educated Patient   Methods Explanation;Handout   Comprehension Verbalized understanding;Returned demonstration             PT Long  Term Goals - 04/23/16 1541      PT LONG TERM GOAL #1   Title Improve posture and alignment with patient to demo improved thoracic extension with posterior shoulder girdle musculature engaged 05/06/16   Time 6   Period Weeks   Status On-going  req freq tactile cues     PT LONG TERM GOAL #2   Title Increase AROM Rt shoulder to without 5-10 degrees of AROM Lt shoudler 05/06/16   Time 6   Period Weeks   Status On-going     PT LONG TERM GOAL #3   Title Patient reports decreased pain in the Rt shoulder to intermitent pain with intensity decreased by 50% 05/06/16   Time 6   Period Weeks   Status On-going     PT LONG TERM GOAL #4   Title Independent in HEP 05/06/16   Time 6   Period Weeks   Status On-going      PT LONG TERM GOAL #5   Title Improve FOTO to </= 36% limitation 05/06/16   Time 6   Period Weeks   Status On-going               Plan - 04/23/16 1537    Clinical Impression Statement Pt demonstrated decreased Rt shoulder ROM this visit, however pt arrive with arm that was fatigued and painful.  She had many questions regarding form of exercise for HEP, so this was reviewed.  She tolerated exercises well despite pain level.  Pt reported decreased pain by 1 point at end of session with use of estim/ MHP.     Rehab Potential Good   PT Frequency 2x / week   PT Duration 6 weeks   PT Treatment/Interventions Patient/family education;ADLs/Self Care Home Management;Cryotherapy;Electrical Stimulation;Iontophoresis 4mg /ml Dexamethasone;Moist Heat;Ultrasound;Dry needling;Manual techniques;Therapeutic activities;Therapeutic exercise   PT Next Visit Plan progress with Rt shoulder ROM, stabilization and strengthening.     Consulted and Agree with Plan of Care Patient      Patient will benefit from skilled therapeutic intervention in order to improve the following deficits and impairments:  Postural dysfunction, Improper body mechanics, Pain, Increased fascial restricitons, Increased muscle spasms, Decreased strength, Impaired UE functional use, Decreased range of motion, Decreased mobility, Decreased activity tolerance  Visit Diagnosis: Chronic right shoulder pain  Stiffness of right shoulder, not elsewhere classified  Muscle weakness (generalized)  Abnormal posture     Problem List Patient Active Problem List   Diagnosis Date Noted  . Acromioclavicular joint arthritis   . Adhesive capsulitis of right shoulder   . Rotator cuff tear 01/06/2016  . Right shoulder pain 01/01/2016   Mayer Camel, PTA 04/23/16 3:42 PM  [facility] [place] [address] [place], [place], [address] Phone: [phone]   Fax:   [phone]  Name: Nicole Gray MRN: [id number] Date of Birth: November 16, 1960

## 2016-04-23 NOTE — Patient Instructions (Signed)
Strengthening: Resisted External Rotation    Hold tubing in right hand, elbow at side and forearm across body. Rotate forearm out. Repeat __10__ times per set. Do _1-2___ sets per session. Do ___3_ sessions per week  [email or web address]    [facility] [address] [place], [place] [address]  [phone] (fax)

## 2016-04-26 ENCOUNTER — Ambulatory Visit (INDEPENDENT_AMBULATORY_CARE_PROVIDER_SITE_OTHER): Payer: Managed Care, Other (non HMO) | Admitting: Physical Therapy

## 2016-04-26 DIAGNOSIS — G8929 Other chronic pain: Secondary | ICD-10-CM | POA: Diagnosis not present

## 2016-04-26 DIAGNOSIS — R293 Abnormal posture: Secondary | ICD-10-CM | POA: Diagnosis not present

## 2016-04-26 DIAGNOSIS — M25611 Stiffness of right shoulder, not elsewhere classified: Secondary | ICD-10-CM

## 2016-04-26 DIAGNOSIS — M25511 Pain in right shoulder: Secondary | ICD-10-CM | POA: Diagnosis not present

## 2016-04-26 DIAGNOSIS — M6281 Muscle weakness (generalized): Secondary | ICD-10-CM

## 2016-04-26 NOTE — Therapy (Signed)
[facility] [address] [place], [place], [address] Phone: [phone]   Fax:  [phone]  Physical Therapy Treatment  Patient Details  Name: Nicole PolioLucy Gray MRN: [id number] Date of Birth: October 02, 1960 Referring Provider: Dr. Dorene GrebeScott Dean   Encounter Date: 04/26/2016      PT End of Session - 04/26/16 1606    Visit Number 7   Number of Visits 12   Date for PT Re-Evaluation 05/06/16   PT Start Time 1605   PT Stop Time 1655   PT Time Calculation (min) 50 min   Activity Tolerance Patient tolerated treatment well   Behavior During Therapy [facility] for tasks assessed/performed      Past Medical History:  Diagnosis Date  . Frozen shoulder   . Headache    migraines  . Osteoarthritis    AC  . Panic attacks   . PONV (postoperative nausea and vomiting)    " I had a  mild panic attack coming out of anesthesia"  . Wears glasses     Past Surgical History:  Procedure Laterality Date  . COLONOSCOPY WITH ESOPHAGOGASTRODUODENOSCOPY (EGD)    . CYST EXCISION    . TONSILLECTOMY      There were no vitals filed for this visit.      Subjective Assessment - 04/26/16 1606    Subjective Pt reports she is trying to wean herself off the Mobic medicine.  She is completing HEP daily. She wakes up with sore Rt shoulder from lying on Rt side.    Currently in Pain? Yes   Pain Score 4   pt has not taken any pain med today.   Pain Location Shoulder   Pain Orientation Right   Pain Descriptors / Indicators Pressure   Aggravating Factors  lifting arm, lying on Rt side.    Pain Relieving Factors TENS, ice             [facility] PT Assessment - 04/26/16 0001      Assessment   Medical Diagnosis Rt frozen shoulder/shoulder dysfunction    Referring Provider Dr. Dorene GrebeScott Dean    Onset Date/Surgical Date 03/04/16   Hand Dominance Right   Next MD Visit 06/02/16     AROM   Right Shoulder Flexion 138 Degrees  standing, AROM    Right Shoulder  ABduction 140 Degrees  in standing, AROM          [facility] Adult PT Treatment/Exercise - 04/26/16 0001      Shoulder Exercises: Prone   Other Prone Exercises Prone Rt row x 12 with 1#, tactile cues for proper scapula motion.      Shoulder Exercises: Standing   Flexion Strengthening;Right;10 reps;Weights  to 90 deg; 2 sets    Shoulder Flexion Weight (lbs) 1   ABduction Strengthening;Right;10 reps;Weights  to 90 deg scaption; 2 sets   Shoulder ABduction Weight (lbs) 1   Other Standing Exercises Rings on hoop x 6 Lt to/from Rt @ ~100 deg of flex x 2 sets   Other Standing Exercises scap squeeze x 5 reps x 10 reps.      Shoulder Exercises: Therapy Ball   Flexion 10 reps  walking under the ball.    ABduction 5 reps  scaption, walking next to ball.   Other Therapy Ball Exercises 90 deg flex with ball CW/CCW x 10 reps x 3 sets     Shoulder Exercises: ROM/Strengthening   Rebounder L1: 2 min forward  2 min backwards     Modalities  Modalities Geologist, engineering IFC   Electrical Stimulation Parameters to tolerance    Electrical Stimulation Goals Pain     Vasopneumatic   Number Minutes Vasopneumatic  15 minutes   Vasopnuematic Location  Shoulder   Vasopneumatic Pressure Low   Vasopneumatic Temperature  32 deg     Manual Therapy   Manual therapy comments Rock tape removed and held so pt can apply vit E oil.                      PT Long Term Goals - 04/23/16 1541      PT LONG TERM GOAL #1   Title Improve posture and alignment with patient to demo improved thoracic extension with posterior shoulder girdle musculature engaged 05/06/16   Time 6   Period Weeks   Status On-going  req freq tactile cues     PT LONG TERM GOAL #2   Title Increase AROM Rt shoulder to without 5-10 degrees of AROM Lt shoudler 05/06/16   Time 6   Period Weeks    Status On-going     PT LONG TERM GOAL #3   Title Patient reports decreased pain in the Rt shoulder to intermitent pain with intensity decreased by 50% 05/06/16   Time 6   Period Weeks   Status On-going     PT LONG TERM GOAL #4   Title Independent in HEP 05/06/16   Time 6   Period Weeks   Status On-going     PT LONG TERM GOAL #5   Title Improve FOTO to </= 36% limitation 05/06/16   Time 6   Period Weeks   Status On-going               Plan - 04/26/16 1644    Clinical Impression Statement Pt demonstrated improved Rt shoulder ROM.  Pt required frequent cues for upright posture. She tolerated all exercises well, with min increase in pain. Pain was reduced with rest breaks between exercises and vaso/estim at end of session.  Pt making good progress towards goals.    Rehab Potential Good   PT Frequency 2x / week   PT Duration 6 weeks   PT Treatment/Interventions Patient/family education;ADLs/Self Care Home Management;Cryotherapy;Electrical Stimulation;Iontophoresis 4mg /ml Dexamethasone;Moist Heat;Ultrasound;Dry needling;Manual techniques;Therapeutic activities;Therapeutic exercise   PT Next Visit Plan progress with Rt shoulder ROM, stabilization and strengthening.     Consulted and Agree with Plan of Care Patient      Patient will benefit from skilled therapeutic intervention in order to improve the following deficits and impairments:  Postural dysfunction, Improper body mechanics, Pain, Increased fascial restricitons, Increased muscle spasms, Decreased strength, Impaired UE functional use, Decreased range of motion, Decreased mobility, Decreased activity tolerance  Visit Diagnosis: Chronic right shoulder pain  Stiffness of right shoulder, not elsewhere classified  Muscle weakness (generalized)  Abnormal posture     Problem List Patient Active Problem List   Diagnosis Date Noted  . Acromioclavicular joint arthritis   . Adhesive capsulitis of right shoulder   .  Rotator cuff tear 01/06/2016  . Right shoulder pain 01/01/2016   Mayer Camel, PTA 04/26/16 4:50 PM  [facility] [place] [address] [place], [place], [address] Phone: [phone]   Fax:  [phone]  Name: Kiona Blume MRN: [id number] Date of Birth: 1960/05/30

## 2016-04-29 ENCOUNTER — Ambulatory Visit (INDEPENDENT_AMBULATORY_CARE_PROVIDER_SITE_OTHER): Payer: 59 | Admitting: Physical Therapy

## 2016-04-29 DIAGNOSIS — R293 Abnormal posture: Secondary | ICD-10-CM

## 2016-04-29 DIAGNOSIS — G8929 Other chronic pain: Secondary | ICD-10-CM

## 2016-04-29 DIAGNOSIS — M6281 Muscle weakness (generalized): Secondary | ICD-10-CM

## 2016-04-29 DIAGNOSIS — M25511 Pain in right shoulder: Secondary | ICD-10-CM

## 2016-04-29 DIAGNOSIS — M25611 Stiffness of right shoulder, not elsewhere classified: Secondary | ICD-10-CM | POA: Diagnosis not present

## 2016-04-29 NOTE — Patient Instructions (Addendum)
Pectoral Stretch, Standing    Stand, hands clasped behind back. Lift hands away from back. Hold _15__ seconds. Repeat __3_ times per session. Do _2__ sessions per day.  Copyright  VHI. All rights reserved.  [facility] [facility] [place]: Single Arm    Arms near sides, palms up. Press one arm lightly into floor, slide arm out to side and up alongside head. Keep contact with floor throughout motion. At maximal position, lengthen arm. Hold __5-10_ seconds. Relax. Repeat _10__ times. Slide arm back to start. Repeat with other arm.  Scapula Adduction With Pectorals, Low    Stand in doorframe with palms against frame and arms at 45. Lean forward. Hold __15_ seconds. Repeat _2-3__ times per session. Do _1-2__ sessions per day.  Flexors Stretch, Standing    Stand near wall and slide arm up, with palm facing away from wall, by leaning toward wall. Hold _5_ seconds.  Repeat __10_ times per session. Do _1__ sessions per day.  [facility] [address] [place], [place] [address]  [phone] (office) [phone] (fax)

## 2016-04-29 NOTE — Therapy (Signed)
[facility] [place] [address] [place], [place], [address] Phone: [phone]   Fax:  [phone]  Physical Therapy Treatment  Patient Details  Name: Nicole Gray MRN: [id number] Date of Birth: 20-Aug-1960 Referring Provider: Dr. Dorene Grebe  Encounter Date: 04/29/2016      PT End of Session - 04/29/16 1514    Visit Number 8   Number of Visits 12   Date for PT Re-Evaluation 05/06/16   PT Start Time 1405   PT Stop Time 1511   PT Time Calculation (min) 66 min   Activity Tolerance Patient tolerated treatment well   Behavior During Therapy [facility] for tasks assessed/performed      Past Medical History:  Diagnosis Date  . Frozen shoulder   . Headache    migraines  . Osteoarthritis    AC  . Panic attacks   . PONV (postoperative nausea and vomiting)    " I had a  mild panic attack coming out of anesthesia"  . Wears glasses     Past Surgical History:  Procedure Laterality Date  . COLONOSCOPY WITH ESOPHAGOGASTRODUODENOSCOPY (EGD)    . CYST EXCISION    . TONSILLECTOMY      There were no vitals filed for this visit.      Subjective Assessment - 04/29/16 1407    Subjective Pt sore from putting a dresser together and carrying piece by piece up flight of stairs. Woke up sore this morning, Still trying to wean off Mobic medicine but has taken it the past few days due to pain.    Currently in Pain? Yes   Pain Score 5    Pain Location Shoulder   Pain Orientation Right   Pain Descriptors / Indicators Constant;Aching   Aggravating Factors  lifting arm, lying on Rt side   Pain Relieving Factors TENS, heat            [facility] PT Assessment - 04/29/16 0001      Assessment   Medical Diagnosis Rt frozen shoulder/shoulder dysfunction    Referring Provider Dr. Dorene Grebe   Onset Date/Surgical Date 03/04/16   Hand Dominance Right   Next MD Visit 06/02/16          [facility] Adult PT Treatment/Exercise - 04/29/16 0001      Shoulder Exercises: Standing   External Rotation Strengthening;Right;10 reps;Theraband   Theraband Level (Shoulder External Rotation) Level 1 (Yellow)   Internal Rotation Strengthening;Right;10 reps   Theraband Level (Shoulder Internal Rotation) Level 1 (Yellow)   Flexion Strengthening;Right;Weights;15 reps  to 90 deg; 2 lbs too heavy    Flexion Limitations VC for posture   ABduction --  to 90 deg scaption   Row Strengthening;Right;Theraband;15 reps   Theraband Level (Shoulder Row) Level 1 (Yellow)   Row Limitations req VC/tactile cues for form/posture    Other Standing Exercises Attempted Rt rockwood flexion x2 reps, pt had pain; stopped      Shoulder Exercises: Pulleys   Flexion --  10 times, 10 sec hold.    ABduction --  10 times, 10 sec hold.      Shoulder Exercises: Therapy Ball   Flexion 10 reps  walking under the ball.    ABduction 5 reps  scaption, walking next to ball.   Other Therapy Ball Exercises --     Shoulder Exercises: Stretch   Other Shoulder Stretches bi lat shoulder extension stretch holding towel behind back, 3 x 30 sec; mid level doorway stretch - 15 sec x 3  reps      Moist Heat Therapy   Number Minutes Moist Heat 15 Minutes   Moist Heat Location Shoulder     Electrical Stimulation   Electrical Stimulation Location Rt shoulder   Electrical Stimulation Action IFC   Electrical Stimulation Parameters to tolerance    Electrical Stimulation Goals Pain     Ultrasound   Ultrasound Location Rt lat/anterior shoulder    Ultrasound Parameters 50%, 1.0 w/cm2, 8 min    Ultrasound Goals Pain  tone, to smooth incision.     Manual Therapy   Manual therapy comments 2 power strips of Rock tape applied over Rt ant shoulder for scar management, reduced sensitivity, and to decrease pain.  (75% stretch in center of tape)                PT Education - 04/29/16 1502    Education provided Yes   Education Details HEP - added ROM/stretches    Person(s)  Educated Patient   Methods Explanation;Handout   Comprehension Verbalized understanding             PT Long Term Goals - 04/23/16 1541      PT LONG TERM GOAL #1   Title Improve posture and alignment with patient to demo improved thoracic extension with posterior shoulder girdle musculature engaged 05/06/16   Time 6   Period Weeks   Status On-going  req freq tactile cues     PT LONG TERM GOAL #2   Title Increase AROM Rt shoulder to without 5-10 degrees of AROM Lt shoudler 05/06/16   Time 6   Period Weeks   Status On-going     PT LONG TERM GOAL #3   Title Patient reports decreased pain in the Rt shoulder to intermitent pain with intensity decreased by 50% 05/06/16   Time 6   Period Weeks   Status On-going     PT LONG TERM GOAL #4   Title Independent in HEP 05/06/16   Time 6   Period Weeks   Status On-going     PT LONG TERM GOAL #5   Title Improve FOTO to </= 36% limitation 05/06/16   Time 6   Period Weeks   Status On-going               Plan - 04/29/16 1519    Clinical Impression Statement Pt continues to require frequent cues for posture and shoulder positioning with exercise.  She tolerated all exercises well with minimal increase in pain. She prefers to have heat vs ice/vaso.  She will be out of town next wk due to family being in town.    Rehab Potential Good   PT Frequency 2x / week   PT Duration 6 weeks   PT Treatment/Interventions Patient/family education;ADLs/Self Care Home Management;Cryotherapy;Electrical Stimulation;Iontophoresis 4mg /ml Dexamethasone;Moist Heat;Ultrasound;Dry needling;Manual techniques;Therapeutic activities;Therapeutic exercise   PT Next Visit Plan progress with Rt shoulder ROM, stabilization and strengthening.     Consulted and Agree with Plan of Care Patient      Patient will benefit from skilled therapeutic intervention in order to improve the following deficits and impairments:  Postural dysfunction, Improper body mechanics,  Pain, Increased fascial restricitons, Increased muscle spasms, Decreased strength, Impaired UE functional use, Decreased range of motion, Decreased mobility, Decreased activity tolerance  Visit Diagnosis: Chronic right shoulder pain  Stiffness of right shoulder, not elsewhere classified  Muscle weakness (generalized)  Abnormal posture     Problem List Patient Active Problem List   Diagnosis Date Noted  .  Acromioclavicular joint arthritis   . Adhesive capsulitis of right shoulder   . Rotator cuff tear 01/06/2016  . Right shoulder pain 01/01/2016   Mayer CamelJennifer Carlson-Long, PTA 04/29/16 3:23 PM  [facility] [address] [place], [place], [address] Phone: [phone]   Fax:  [phone]  Name: Nena PolioLucy Drotar MRN: [id number] Date of Birth: [birth year]

## 2016-05-03 ENCOUNTER — Encounter: Payer: Managed Care, Other (non HMO) | Admitting: Physical Therapy

## 2016-05-06 ENCOUNTER — Encounter: Payer: Managed Care, Other (non HMO) | Admitting: Physical Therapy

## 2016-05-10 ENCOUNTER — Ambulatory Visit (INDEPENDENT_AMBULATORY_CARE_PROVIDER_SITE_OTHER): Payer: 59 | Admitting: Physical Therapy

## 2016-05-10 DIAGNOSIS — M6281 Muscle weakness (generalized): Secondary | ICD-10-CM

## 2016-05-10 DIAGNOSIS — R293 Abnormal posture: Secondary | ICD-10-CM

## 2016-05-10 DIAGNOSIS — G8929 Other chronic pain: Secondary | ICD-10-CM | POA: Diagnosis not present

## 2016-05-10 DIAGNOSIS — M25511 Pain in right shoulder: Secondary | ICD-10-CM

## 2016-05-10 DIAGNOSIS — M25611 Stiffness of right shoulder, not elsewhere classified: Secondary | ICD-10-CM

## 2016-05-10 NOTE — Therapy (Addendum)
[place] [address] [place], [place], [address] Phone: [phone]   Fax:  [phone]  Physical Therapy Treatment  Patient Details  Name: Nicole Gray MRN: [id number] Date of Birth: 1961/02/16 Referring Provider: Dr. Alphonzo Severance  Encounter Date: 05/10/2016      PT End of Session - 05/10/16 1430    Visit Number 9   Number of Visits 12   Date for PT Re-Evaluation 05/06/16   PT Start Time 1430   PT Stop Time 1526   PT Time Calculation (min) 56 min   Activity Tolerance Patient tolerated treatment well      Past Medical History:  Diagnosis Date  . Frozen shoulder   . Headache    migraines  . Osteoarthritis    AC  . Panic attacks   . PONV (postoperative nausea and vomiting)    " I had a  mild panic attack coming out of anesthesia"  . Wears glasses     Past Surgical History:  Procedure Laterality Date  . COLONOSCOPY WITH ESOPHAGOGASTRODUODENOSCOPY (EGD)    . CYST EXCISION    . TONSILLECTOMY      There were no vitals filed for this visit.      Subjective Assessment - 05/10/16 1513    Subjective Pt was off from therapy last week due to family in town.  She only worked on ROM/stretches due to time constraints.  She is weaning off of Meloxicam.     Patient Stated Goals use Rt UE for normal functional activities; get rid of pain    Currently in Pain? Yes   Pain Score 3    Pain Location Shoulder   Pain Orientation Right   Pain Descriptors / Indicators Dull;Aching   Aggravating Factors  lying on Rt side, lifting arm past confortable height   Pain Relieving Factors TENS, heat.             [facility] PT Assessment - 05/10/16 0001      Observation/Other Assessments   Focus on Therapeutic Outcomes (FOTO)  47% limited, goal 36%.      AROM   Right Shoulder Flexion 133 Degrees  standing. 138 deg in supine   Right Shoulder ABduction 125 Degrees  standing, AROM   Right Shoulder Internal Rotation --  to  T10 behind back   Right Shoulder External Rotation 68 Degrees  supine, abdct to 80 deg          [facility] Adult PT Treatment/Exercise - 05/10/16 0001      Shoulder Exercises: Supine   Flexion AROM;Right;10 reps  overhead pull.    Other Supine Exercises hands behind head, pressing elbows down x 10 reps.    Other Supine Exercises retraction bilat with yellow band x 10 reps, 2 sets     Shoulder Exercises: Standing   Row Strengthening;Right;Theraband;15 reps  2 sets   Theraband Level (Shoulder Row) Level 1 (Yellow)   Other Standing Exercises wall ladder:  Rt flexion x 10 reps, abd 10 reps (#23 on ladder)   Other Standing Exercises reverse wall push up with freq cues on form x 10 reps.      Shoulder Exercises: ROM/Strengthening   UBE (Upper Arm Bike) L1:  3 min backward, VC for posture.    Rhythmic Stabilization, Supine 20 sec x 2 reps at 45 deg, 90 deg, 110 deg.    Other ROM/Strengthening Exercises Rt hand behind back x 3 reps; walking Rt arm up ball on wall x 1 reps  Moist Heat Therapy   Number Minutes Moist Heat 15 Minutes   Moist Heat Location Shoulder  Rt.      Horticulturist, commercial IFC   Electrical Stimulation Parameters to tolerance    Electrical Stimulation Goals Pain                     PT Long Term Goals - 05/10/16 1506      PT LONG TERM GOAL #1   Title Improve posture and alignment with patient to demo improved thoracic extension with posterior shoulder girdle musculature engaged 05/06/16   Time 6   Period Weeks   Status On-going     PT LONG TERM GOAL #2   Title Increase AROM Rt shoulder to without 5-10 degrees of AROM Lt shoudler 05/06/16   Time 6   Period Weeks   Status On-going     PT LONG TERM GOAL #3   Title Patient reports decreased pain in the Rt shoulder to intermitent pain with intensity decreased by 50% 05/06/16   Time 6   Period Weeks     PT LONG TERM  GOAL #4   Title Independent in HEP 05/06/16   Time 6   Period Weeks   Status On-going     PT LONG TERM GOAL #5   Title Improve FOTO to </= 36% limitation 05/06/16   Time 6   Period Weeks   Status On-going               Plan - 05/10/16 1511    Clinical Impression Statement Pt demonstrated similar Rt shoulder flexion ROM to last visit, improved ER ROM.  She tolerated all exercises well with minimal increase in pain. She is progressing gradually towards goals.  She will benefit from continued PT intervention to maximize function.    Rehab Potential Good   PT Frequency 2x / week   PT Duration 6 weeks   PT Treatment/Interventions Patient/family education;ADLs/Self Care Home Management;Cryotherapy;Electrical Stimulation;Iontophoresis 34m/ml Dexamethasone;Moist Heat;Ultrasound;Dry needling;Manual techniques;Therapeutic activities;Therapeutic exercise   PT Next Visit Plan Spoke to supervising PT regarding pt's progress and her desire to cont therapy; will send recert to MD.  Cont progressive ROM/strengthening of Rt shoulder.    Consulted and Agree with Plan of Care Patient      Patient will benefit from skilled therapeutic intervention in order to improve the following deficits and impairments:  Postural dysfunction, Improper body mechanics, Pain, Increased fascial restricitons, Increased muscle spasms, Decreased strength, Impaired UE functional use, Decreased range of motion, Decreased mobility, Decreased activity tolerance  Visit Diagnosis: Chronic right shoulder pain  Stiffness of right shoulder, not elsewhere classified  Muscle weakness (generalized)  Abnormal posture     Problem List Patient Active Problem List   Diagnosis Date Noted  . Acromioclavicular joint arthritis   . Adhesive capsulitis of right shoulder   . Rotator cuff tear 01/06/2016  . Right shoulder pain 01/01/2016   JKerin Perna PTA 05/10/16 5:08 PM   Celyn P. HHelene KelpPT, MPH 05/11/16 8:20  AM    [facility] [place] [address]Phone: [phone]  Fax:  [phone] Name: LHenreitta SpittlerMRN: [id number] of Birth: [birth year] PHYSICAL THERAPY DISCHARGE SUMMARY  Visits from Start of Care: 9  Current functional level related to goals / functional outcomes: See progress note for current status    Remaining deficits: See note  Education / Equipment: HEP  Plan: Patient agrees to discharge.  Patient goals were partially met. Patient is being discharged due to not returning since the last visit.  ?????     Celyn P. Helene Kelp PT, MPH 06/03/16 9:04 AM

## 2016-05-11 NOTE — Addendum Note (Signed)
Addended by: Val RilesHOLT, Waylon Koffler P on: 05/11/2016 08:20 AM   Modules accepted: Orders

## 2016-05-13 ENCOUNTER — Encounter: Payer: Managed Care, Other (non HMO) | Admitting: Physical Therapy

## 2016-05-27 ENCOUNTER — Ambulatory Visit (INDEPENDENT_AMBULATORY_CARE_PROVIDER_SITE_OTHER): Payer: Managed Care, Other (non HMO) | Admitting: Orthopedic Surgery

## 2016-06-23 ENCOUNTER — Telehealth (INDEPENDENT_AMBULATORY_CARE_PROVIDER_SITE_OTHER): Payer: Self-pay | Admitting: Orthopedic Surgery

## 2016-06-23 NOTE — Telephone Encounter (Signed)
RECEIVED REQUEST FOR RECORDS FROM [employer] FOR [employer] 03-29-2016-PRESENT. I FAXED BACK ADVISING "NO RECORDS WITHIN REQUESTED DATES"

## 2017-06-01 ENCOUNTER — Ambulatory Visit (INDEPENDENT_AMBULATORY_CARE_PROVIDER_SITE_OTHER): Payer: Commercial Managed Care - PPO | Admitting: Family Medicine

## 2017-06-01 ENCOUNTER — Encounter: Payer: Self-pay | Admitting: Family Medicine

## 2017-06-01 VITALS — BP 120/82 | HR 80 | Temp 98.0°F | Ht 63.0 in | Wt 154.0 lb

## 2017-06-01 DIAGNOSIS — F410 Panic disorder [episodic paroxysmal anxiety] without agoraphobia: Secondary | ICD-10-CM | POA: Insufficient documentation

## 2017-06-01 DIAGNOSIS — M25552 Pain in left hip: Secondary | ICD-10-CM

## 2017-06-01 MED ORDER — ALPRAZOLAM 0.5 MG PO TABS: 0.5000 mg | ORAL_TABLET | Freq: Every evening | ORAL | 0 refills | Status: DC | PRN

## 2017-06-01 NOTE — Progress Notes (Signed)
Chief Complaint  Patient presents with  . Establish Care       New Patient Visit SUBJECTIVE: HPI: Nicole Gray is an 57 y.o.female who is being seen for establishing care.  [place]  Hx of panic attacks, used to be on daily basis when [place] lived in [place]. Was placed on daily Xanax that did help. She now will have issues around 6 times per year. Not following with counselor. Tolerating Xanax well with no AE's.  Hx of fx of L iliac crest. It healed, but will still have intermittent pain over area when she rolls over onto her L side. No skin changes, swelling, bruising, numbness, tingling or weakness. No imaging after initial fx.   No Known Allergies  Past Medical History:  Diagnosis Date  . Frozen shoulder   . Headache    migraines  . Osteoarthritis    AC  . Panic attacks   . PONV (postoperative nausea and vomiting)    " I had a  mild panic attack coming out of anesthesia"  . Wears glasses    Past Surgical History:  Procedure Laterality Date  . COLONOSCOPY WITH ESOPHAGOGASTRODUODENOSCOPY (EGD)    . CYST EXCISION    . TONSILLECTOMY      Family History  Problem Relation Age of Onset  . Cancer Mother        breast  . Hypertension Mother   . Gout Father      Current Outpatient Medications:  .  ALPRAZolam (XANAX) 0.5 MG tablet, Take 1 tablet (0.5 mg total) by mouth at bedtime as needed for anxiety., Disp: 30 tablet, Rfl: 0  No LMP recorded. Patient is postmenopausal.  ROS Psych: As noted in HPI  MSK: Denies current hip pain   OBJECTIVE: BP 120/82 (BP Location: Right Arm, Patient Position: Sitting, Cuff Size: Normal)   Pulse 80   Temp 98 F (36.7 C) (Oral)   Ht 5\' 3"  (1.6 m)   Wt 154 lb (69.9 kg)   SpO2 94%   BMI 27.28 kg/m   Constitutional: -  VS reviewed -  Well developed, well nourished, appears stated age -  No apparent distress  Psychiatric: -  Oriented to person, place, and time -  Memory intact -  Affect and  mood normal -  Fluent conversation, good eye contact -  Judgment and insight age appropriate  ENMT: -  MMM    Pharynx moist, no exudate, no erythema  Neck: -  No gross swelling, no palpable masses -  Thyroid midline, not enlarged, mobile, no palpable masses  Cardiovascular: -  RRR -  No LE edema -  No bruits  Respiratory: -  Normal respiratory effort, no accessory muscle use, no retraction -  Breath sounds equal, no wheezes, no ronchi, no crackles  Neurological:  -  No cerebellar signs -  DTR's equal and symmetric in LE's  Musculoskeletal: -  No clubbing, no cyanosis -  Gait normal -  +TTP over peak of L iliac crest, no deformity, crepitus or skin changes -  Neg Stinchfield, log roll, FADDIR, FABER on L  Skin: -  No significant lesion on inspection -  Warm and dry to palpation   ASSESSMENT/PLAN: Panic attack - Plan: ALPRAZolam (XANAX) 0.5 MG tablet  Pain of left hip joint  Patient instructed to sign release of records form from her previous PCP. Xanax prn. If she starts using more freq, will discuss SSRI vs counseling. Discussed options for  residual pain, post traumatic. Offered watchful waiting vs referral to sports med. She opted for the former for now.  Patient should return at earliest convenience for CPE. The patient voiced understanding and agreement to the plan.   Jilda Roche Grayson, DO 06/01/17  2:57 PM

## 2017-06-01 NOTE — Progress Notes (Signed)
Pre visit review using our clinic review tool, if applicable. No additional management support is needed unless otherwise documented below in the visit note. 

## 2017-06-01 NOTE — Patient Instructions (Addendum)
Coping skills Choose 5 that work for you:  Take a deep breath  Count to 20  Read a book  Do a puzzle  Meditate  Bake  Sing  Knit  Garden  Pray  Go outside  Call a friend  Listen to music  Take a walk  Color  Send a note  Take a bath  Watch a movie  Be alone in a quiet place  Pet an animal  Visit a friend  Journal  Exercise  Stretch   Let me know if you have a desire to see a sports medicine specialist for your hip.   Let us know if you need anything.

## 2017-07-20 ENCOUNTER — Encounter: Payer: Self-pay | Admitting: Family Medicine

## 2017-07-20 ENCOUNTER — Ambulatory Visit (INDEPENDENT_AMBULATORY_CARE_PROVIDER_SITE_OTHER): Payer: Commercial Managed Care - PPO | Admitting: Family Medicine

## 2017-07-20 VITALS — BP 118/68 | HR 61 | Temp 97.6°F | Ht 63.0 in | Wt 154.0 lb

## 2017-07-20 DIAGNOSIS — Z1231 Encounter for screening mammogram for malignant neoplasm of breast: Secondary | ICD-10-CM | POA: Diagnosis not present

## 2017-07-20 DIAGNOSIS — Z1239 Encounter for other screening for malignant neoplasm of breast: Secondary | ICD-10-CM

## 2017-07-20 DIAGNOSIS — Z0000 Encounter for general adult medical examination without abnormal findings: Secondary | ICD-10-CM

## 2017-07-20 DIAGNOSIS — Z1159 Encounter for screening for other viral diseases: Secondary | ICD-10-CM

## 2017-07-20 DIAGNOSIS — Z114 Encounter for screening for human immunodeficiency virus [HIV]: Secondary | ICD-10-CM

## 2017-07-20 DIAGNOSIS — H9193 Unspecified hearing loss, bilateral: Secondary | ICD-10-CM

## 2017-07-20 LAB — COMPREHENSIVE METABOLIC PANEL
ALK PHOS: 90 U/L (ref 39–117)
ALT: 30 U/L (ref 0–35)
AST: 21 U/L (ref 0–37)
Albumin: 4.7 g/dL (ref 3.5–5.2)
BUN: 14 mg/dL (ref 6–23)
CHLORIDE: 103 meq/L (ref 96–112)
CO2: 28 meq/L (ref 19–32)
Calcium: 9.9 mg/dL (ref 8.4–10.5)
Creatinine, Ser: 0.75 mg/dL (ref 0.40–1.20)
GFR: 84.22 mL/min (ref >60.00)
GLUCOSE: 89 mg/dL (ref 70–99)
POTASSIUM: 4.7 meq/L (ref 3.5–5.1)
SODIUM: 140 meq/L (ref 135–145)
TOTAL PROTEIN: 7.3 g/dL (ref 6.0–8.3)
Total Bilirubin: 0.3 mg/dL (ref 0.2–1.2)

## 2017-07-20 LAB — LDL CHOLESTEROL, DIRECT: Direct LDL: 208 mg/dL

## 2017-07-20 LAB — LIPID PANEL
CHOL/HDL RATIO: 6
Cholesterol: 305 mg/dL — ABNORMAL HIGH (ref 0–200)
HDL: 50.7 mg/dL (ref >39.00)
NONHDL: 254.44
Triglycerides: 304 mg/dL — ABNORMAL HIGH (ref 0.0–149.0)
VLDL: 60.8 mg/dL — ABNORMAL HIGH (ref 0.0–40.0)

## 2017-07-20 NOTE — Progress Notes (Signed)
Pre visit review using our clinic review tool, if applicable. No additional management support is needed unless otherwise documented below in the visit note. 

## 2017-07-20 NOTE — Patient Instructions (Addendum)
Keep up the good work.  Keep the diet clean, try to add weight lifting to your regimen.  Call Center for [facility] at [facility] at [phone] for an appointment.  They are located at [address], [place], [place], [address] (right across the hall from our office).   Let us know if you need anything.

## 2017-07-20 NOTE — Progress Notes (Signed)
Chief Complaint  Patient presents with  . Annual Exam     Well Woman Nicole Gray is here for a complete physical.   Her last physical was >1 year ago.  Current diet: in general, a "healthy" diet. Current exercise: walking. Weight is stable and she denies daytime fatigue. No LMP recorded. Patient is postmenopausal..  Seatbelt? Yes  Health Maintenance Pap/HPV- Yes 2 years ago; 08/23/2015 Mammogram- No Tetanus- Yes 08/23/2015 Hep C screening- No HIV screening- No  Colonoscopy- Yes - 3 years ago  Past Medical History:  Diagnosis Date  . Frozen shoulder   . Headache    migraines  . Osteoarthritis    AC  . Panic attacks   . PONV (postoperative nausea and vomiting)    " I had a  mild panic attack coming out of anesthesia"  . Wears glasses      Past Surgical History:  Procedure Laterality Date  . COLONOSCOPY WITH ESOPHAGOGASTRODUODENOSCOPY (EGD)    . CYST EXCISION    . TONSILLECTOMY     Medications  Current Outpatient Medications on File Prior to Visit  Medication Sig Dispense Refill  . ALPRAZolam (XANAX) 0.5 MG tablet Take 1 tablet (0.5 mg total) by mouth at bedtime as needed for anxiety. 30 tablet 0   Allergies No Known Allergies  Review of Systems: Constitutional:  no unexpected weight changes Eye:  no recent significant change in vision Ear/Nose/Mouth/Throat:  Ears:  no tinnitus or vertigo and +poor hearing b/l Nose/Mouth/Throat:  no complaints of nasal congestion, no sore throat Cardiovascular: no chest pain Respiratory:  no cough and no shortness of breath Gastrointestinal:  no abdominal pain, no change in bowel habits GU:  Female: negative for dysuria or pelvic pain Musculoskeletal/Extremities: +upper R back pain; otherwise no pain of the joints Integumentary (Skin/Breast):  no abnormal skin lesions reported Neurologic:  no headaches Endocrine:  denies fatigue Hematologic/Lymphatic:  No areas of easy bleeding  Exam BP 118/68 (BP Location: Left Arm,  Patient Position: Sitting, Cuff Size: Normal)   Pulse 61   Temp 97.6 F (36.4 C) (Oral)   Ht  (1.6 m)   Wt 154 lb (69.9 kg)   SpO2 98%   BMI 27.28 kg/m  General:  well developed, well nourished, in no apparent distress Skin:  no significant moles, warts, or growths Head:  no masses, lesions, or tenderness Eyes:  pupils equal and round, sclera anicteric without injection Ears:  canals without lesions, TMs shiny without retraction, no obvious effusion, no erythema Nose:  nares patent, septum midline, mucosa normal, and no drainage or sinus tenderness Throat/Pharynx:  lips and gingiva without lesion; tongue and uvula midline; non-inflamed pharynx; no exudates or postnasal drainage Neck: neck supple without adenopathy, thyromegaly, or masses Lungs:  clear to auscultation, breath sounds equal bilaterally, no respiratory distress Cardio:  regular rate and rhythm, no bruits, no LE edema Abdomen:  abdomen soft, nontender; bowel sounds normal; no masses or organomegaly Genital: Defer to GYN Musculoskeletal:  symmetrical muscle groups noted without atrophy or deformity Extremities:  no clubbing, cyanosis, or edema, no deformities, no skin discoloration Neuro:  gait normal; deep tendon reflexes normal and symmetric Psych: well oriented with normal range of affect and appropriate judgment/insight  Assessment and Plan  Well adult exam - Plan: Lipid panel, Comprehensive metabolic panel  Screening for breast cancer - Plan: MM DIGITAL SCREENING BILATERAL  Encounter for hepatitis C screening test for low risk patient - Plan: Hepatitis C antibody  Screening for HIV (human immunodeficiency  virus) - Plan: HIV antibody  Bilateral hearing loss, unspecified hearing loss type - Plan: Ambulatory referral to Audiology   Well 57 y.o. female. Counseled on diet and exercise. Challenged her to lift weights. Doing well otherwise. Other orders as above. Follow up 1 yr for CPE or prn. The patient  voiced understanding and agreement to the plan.  Jilda Roche Gordon, DO 07/20/17 1:24 PM

## 2017-07-21 LAB — HEPATITIS C ANTIBODY
Hepatitis C Ab: NONREACTIVE
SIGNAL TO CUT-OFF: 0.01 (ref <1.00)

## 2017-07-21 LAB — HIV ANTIBODY (ROUTINE TESTING W REFLEX): HIV: NONREACTIVE

## 2017-07-22 ENCOUNTER — Other Ambulatory Visit: Payer: Self-pay | Admitting: Family Medicine

## 2017-07-22 DIAGNOSIS — E781 Pure hyperglyceridemia: Secondary | ICD-10-CM

## 2017-08-18 ENCOUNTER — Encounter: Payer: Self-pay | Admitting: Family Medicine

## 2017-08-19 ENCOUNTER — Other Ambulatory Visit (INDEPENDENT_AMBULATORY_CARE_PROVIDER_SITE_OTHER): Payer: Commercial Managed Care - PPO

## 2017-08-19 ENCOUNTER — Encounter: Payer: Self-pay | Admitting: Family Medicine

## 2017-08-19 DIAGNOSIS — E781 Pure hyperglyceridemia: Secondary | ICD-10-CM | POA: Diagnosis not present

## 2017-08-19 LAB — LIPID PANEL
CHOL/HDL RATIO: 5
CHOLESTEROL: 251 mg/dL — AB (ref 0–200)
HDL: 46.8 mg/dL (ref >39.00)
LDL CALC: 166 mg/dL — AB (ref 0–99)
NONHDL: 204.26
Triglycerides: 193 mg/dL — ABNORMAL HIGH (ref 0.0–149.0)
VLDL: 38.6 mg/dL (ref 0.0–40.0)

## 2017-11-01 ENCOUNTER — Encounter: Payer: Self-pay | Admitting: Family Medicine

## 2017-11-01 ENCOUNTER — Ambulatory Visit (INDEPENDENT_AMBULATORY_CARE_PROVIDER_SITE_OTHER): Payer: Commercial Managed Care - PPO | Admitting: Family Medicine

## 2017-11-01 VITALS — BP 122/72 | HR 98 | Temp 98.2°F | Resp 16 | Ht 63.0 in | Wt 151.0 lb

## 2017-11-01 DIAGNOSIS — J029 Acute pharyngitis, unspecified: Secondary | ICD-10-CM

## 2017-11-01 DIAGNOSIS — R05 Cough: Secondary | ICD-10-CM | POA: Diagnosis not present

## 2017-11-01 DIAGNOSIS — R059 Cough, unspecified: Secondary | ICD-10-CM

## 2017-11-01 DIAGNOSIS — J324 Chronic pansinusitis: Secondary | ICD-10-CM

## 2017-11-01 LAB — POCT RAPID STREP A (OFFICE): Rapid Strep A Screen: NEGATIVE

## 2017-11-01 MED ORDER — PROMETHAZINE-DM 6.25-15 MG/5ML PO SYRP: 5.0000 mL | ORAL_SOLUTION | Freq: Four times a day (QID) | ORAL | 0 refills | Status: DC | PRN

## 2017-11-01 MED ORDER — AMOXICILLIN 875 MG PO TABS: 875.0000 mg | ORAL_TABLET | Freq: Two times a day (BID) | ORAL | 0 refills | Status: DC

## 2017-11-01 MED ORDER — FLUTICASONE PROPIONATE 50 MCG/ACT NA SUSP: 2.0000 | Freq: Every day | NASAL | 6 refills | Status: DC

## 2017-11-01 MED FILL — AMOXICILLIN 875 MG TABS: 875 | 10 days supply | Qty: 20 | Fill #0

## 2017-11-01 MED FILL — FLUTICASONE PROP 50 MCG SPR: 50 | 30 days supply | Qty: 16 | Fill #0

## 2017-11-01 MED FILL — PROMETHAZINE W/DM SYRUP: 6.25-15 | 6 days supply | Qty: 118 | Fill #0

## 2017-11-01 NOTE — Patient Instructions (Signed)

## 2017-11-01 NOTE — Progress Notes (Signed)
Patient ID: Nicole Gray, female    DOB: Mar 17, 1960  Age: 57 y.o. MRN: [id number]    Subjective:  Subjective  HPI Nicole Gray presents for sore throat , fever , sinus headache / congestion.  She is taking otc flu med with little relief.    Review of Systems  Constitutional: Positive for chills and fever.  HENT: Positive for postnasal drip, rhinorrhea, sinus pressure, sinus pain, sneezing and sore throat. Negative for congestion, hearing loss, trouble swallowing and voice change.   Eyes: Negative for discharge.  Respiratory: Positive for cough. Negative for chest tightness, shortness of breath and wheezing.   Cardiovascular: Negative for chest pain, palpitations and leg swelling.  Gastrointestinal: Negative for abdominal pain, blood in stool, constipation, diarrhea, nausea and vomiting.  Genitourinary: Negative for dysuria, frequency, hematuria and urgency.  Musculoskeletal: Negative for back pain and myalgias.  Skin: Negative for rash.  Allergic/Immunologic: Negative for environmental allergies.  Neurological: Negative for dizziness, weakness and headaches.  Hematological: Does not bruise/bleed easily.  Psychiatric/Behavioral: Negative for suicidal ideas. The patient is not nervous/anxious.     History Past Medical History:  Diagnosis Date  . Frozen shoulder   . Headache    migraines  . Osteoarthritis    AC  . Panic attacks   . PONV (postoperative nausea and vomiting)    " I had a  mild panic attack coming out of anesthesia"  . Wears glasses     She has a past surgical history that includes Tonsillectomy; Cyst excision; and Colonoscopy with esophagogastroduodenoscopy (egd).   Her family history includes Cancer in her mother; Gout in her father; Hypertension in her mother.She reports that she has never smoked. She has never used smokeless tobacco. She reports that she drinks alcohol. She reports that she does not use drugs.  Current Outpatient Medications on File Prior to  Visit  Medication Sig Dispense Refill  . ALPRAZolam (XANAX) 0.5 MG tablet Take 1 tablet (0.5 mg total) by mouth at bedtime as needed for anxiety. 30 tablet 0   No current facility-administered medications on file prior to visit.      Objective:  Objective  Physical Exam  Constitutional: She is oriented to person, place, and time. She appears well-developed and well-nourished.  HENT:  Right Ear: External ear normal.  Left Ear: External ear normal.  Nose: Right sinus exhibits maxillary sinus tenderness and frontal sinus tenderness. Left sinus exhibits maxillary sinus tenderness and frontal sinus tenderness.  Mouth/Throat: Posterior oropharyngeal erythema present.  + PND + errythema  Eyes: Conjunctivae are normal. Right eye exhibits no discharge. Left eye exhibits no discharge.  Cardiovascular: Normal rate, regular rhythm and normal heart sounds.  No murmur heard. Pulmonary/Chest: Effort normal and breath sounds normal. No respiratory distress. She has no wheezes. She has no rales. She exhibits no tenderness.  Musculoskeletal: She exhibits no edema.  Lymphadenopathy:    She has cervical adenopathy.  Neurological: She is alert and oriented to person, place, and time.  Nursing note and vitals reviewed.  BP 122/72 (BP Location: Right Arm, Patient Position: Sitting, Cuff Size: Normal)   Pulse 98   Temp 98.2 F (36.8 C) (Oral)   Resp 16   Ht 5\' 3"  (1.6 m)   Wt 151 lb (68.5 kg)   SpO2 99%   BMI 26.75 kg/m  Wt Readings from Last 3 Encounters:  11/01/17 151 lb (68.5 kg)  07/20/17 154 lb (69.9 kg)  06/01/17 154 lb (69.9 kg)     Lab Results  Component  Value Date   WBC 7.3 02/25/2016   HGB 13.8 02/25/2016   HCT 40.3 02/25/2016   PLT 286 02/25/2016   GLUCOSE 89 07/20/2017   CHOL 251 (H) 08/19/2017   TRIG 193.0 (H) 08/19/2017   HDL 46.80 08/19/2017   LDLDIRECT 208.0 07/20/2017   LDLCALC 166 (H) 08/19/2017   ALT 30 07/20/2017   AST 21 07/20/2017   NA 140 07/20/2017   K 4.7  07/20/2017   CL 103 07/20/2017   CREATININE 0.75 07/20/2017   BUN 14 07/20/2017   CO2 28 07/20/2017    No results found.   Assessment & Plan:  Plan  I am having Nicole Gray maintain her ALPRAZolam, fluticasone, amoxicillin, and promethazine-dextromethorphan.  Meds ordered this encounter  Medications  . DISCONTD: fluticasone (FLONASE) 50 MCG/ACT nasal spray    Sig: Place 2 sprays into both nostrils daily.    Dispense:  16 g    Refill:  6  . DISCONTD: amoxicillin (AMOXIL) 875 MG tablet    Sig: Take 1 tablet (875 mg total) by mouth 2 (two) times daily.    Dispense:  20 tablet    Refill:  0  . DISCONTD: promethazine-dextromethorphan (PROMETHAZINE-DM) 6.25-15 MG/5ML syrup    Sig: Take 5 mLs by mouth 4 (four) times daily as needed.    Dispense:  118 mL    Refill:  0  . fluticasone (FLONASE) 50 MCG/ACT nasal spray    Sig: Place 2 sprays into both nostrils daily.    Dispense:  16 g    Refill:  6  . amoxicillin (AMOXIL) 875 MG tablet    Sig: Take 1 tablet (875 mg total) by mouth 2 (two) times daily.    Dispense:  20 tablet    Refill:  0  . promethazine-dextromethorphan (PROMETHAZINE-DM) 6.25-15 MG/5ML syrup    Sig: Take 5 mLs by mouth 4 (four) times daily as needed.    Dispense:  118 mL    Refill:  0    Problem List Items Addressed This Visit    None    Visit Diagnoses    Sore throat    -  Primary   Relevant Orders   POCT rapid strep A (Completed)   Pansinusitis, unspecified chronicity       Relevant Medications   fluticasone (FLONASE) 50 MCG/ACT nasal spray   amoxicillin (AMOXIL) 875 MG tablet   promethazine-dextromethorphan (PROMETHAZINE-DM) 6.25-15 MG/5ML syrup   Cough       Relevant Medications   promethazine-dextromethorphan (PROMETHAZINE-DM) 6.25-15 MG/5ML syrup    con't tylenol/ advil prn Drink plenty of liquids Cont claritin rto prn   Follow-up: Return if symptoms worsen or fail to improve.  Donato Schultz, DO

## 2018-12-01 ENCOUNTER — Ambulatory Visit (INDEPENDENT_AMBULATORY_CARE_PROVIDER_SITE_OTHER): Payer: Commercial Managed Care - PPO | Admitting: *Deleted

## 2018-12-01 ENCOUNTER — Other Ambulatory Visit: Payer: Self-pay

## 2018-12-01 DIAGNOSIS — Z23 Encounter for immunization: Secondary | ICD-10-CM | POA: Diagnosis not present

## 2018-12-01 NOTE — Progress Notes (Signed)
Patient her today for flu vaccine.  Vaccine given in left deltoid and patient tolerated well.

## 2019-03-30 ENCOUNTER — Other Ambulatory Visit: Payer: Self-pay

## 2019-03-30 ENCOUNTER — Ambulatory Visit: Payer: Commercial Managed Care - PPO | Admitting: Family Medicine

## 2019-03-30 ENCOUNTER — Encounter: Payer: Self-pay | Admitting: Family Medicine

## 2019-03-30 ENCOUNTER — Ambulatory Visit (HOSPITAL_BASED_OUTPATIENT_CLINIC_OR_DEPARTMENT_OTHER)
Admission: RE | Admit: 2019-03-30 | Discharge: 2019-03-30 | Disposition: A | Discharge status: Home / Self Care | Payer: Commercial Managed Care - PPO | Source: Ambulatory Visit | Attending: Family Medicine | Admitting: Family Medicine

## 2019-03-30 VITALS — BP 130/80 | HR 85 | Temp 97.8°F | Resp 18 | Ht 63.0 in | Wt 156.8 lb

## 2019-03-30 DIAGNOSIS — R1011 Right upper quadrant pain: Secondary | ICD-10-CM | POA: Diagnosis present

## 2019-03-30 DIAGNOSIS — Q991 46, XX true hermaphrodite: Secondary | ICD-10-CM | POA: Insufficient documentation

## 2019-03-30 DIAGNOSIS — M549 Dorsalgia, unspecified: Secondary | ICD-10-CM | POA: Diagnosis not present

## 2019-03-30 DIAGNOSIS — K625 Hemorrhage of anus and rectum: Secondary | ICD-10-CM | POA: Insufficient documentation

## 2019-03-30 DIAGNOSIS — R109 Unspecified abdominal pain: Secondary | ICD-10-CM | POA: Insufficient documentation

## 2019-03-30 LAB — CBC WITH DIFFERENTIAL/PLATELET
Basophils Absolute: 0 10*3/uL (ref 0.0–0.1)
Basophils Relative: 0.3 % (ref 0.0–3.0)
Eosinophils Absolute: 0 10*3/uL (ref 0.0–0.7)
Eosinophils Relative: 0.3 % (ref 0.0–5.0)
HCT: 41.5 % (ref 36.0–46.0)
Hemoglobin: 13.7 g/dL (ref 12.0–15.0)
Lymphocytes Relative: 22.6 % (ref 12.0–46.0)
Lymphs Abs: 1.8 10*3/uL (ref 0.7–4.0)
MCHC: 33.2 g/dL (ref 30.0–36.0)
MCV: 92 fl (ref 78.0–100.0)
Monocytes Absolute: 0.4 10*3/uL (ref 0.1–1.0)
Monocytes Relative: 5.4 % (ref 3.0–12.0)
Neutro Abs: 5.6 10*3/uL (ref 1.4–7.7)
Neutrophils Relative %: 71.4 % (ref 43.0–77.0)
Platelets: 231 10*3/uL (ref 150.0–400.0)
RBC: 4.51 Mil/uL (ref 3.87–5.11)
RDW: 13.5 % (ref 11.5–15.5)
WBC: 7.9 10*3/uL (ref 4.0–10.5)

## 2019-03-30 LAB — COMPREHENSIVE METABOLIC PANEL
ALT: 22 U/L (ref 0–35)
AST: 16 U/L (ref 0–37)
Albumin: 4.4 g/dL (ref 3.5–5.2)
Alkaline Phosphatase: 79 U/L (ref 39–117)
BUN: 11 mg/dL (ref 6–23)
CO2: 27 mEq/L (ref 19–32)
Calcium: 9.3 mg/dL (ref 8.4–10.5)
Chloride: 106 mEq/L (ref 96–112)
Creatinine, Ser: 0.67 mg/dL (ref 0.40–1.20)
GFR: 89.74 mL/min (ref >60.00)
Glucose, Bld: 102 mg/dL — ABNORMAL HIGH (ref 70–99)
Potassium: 4 mEq/L (ref 3.5–5.1)
Sodium: 140 mEq/L (ref 135–145)
Total Bilirubin: 0.3 mg/dL (ref 0.2–1.2)
Total Protein: 6.6 g/dL (ref 6.0–8.3)

## 2019-03-30 LAB — POCT URINALYSIS DIP (MANUAL ENTRY)
Bilirubin, UA: NEGATIVE
Blood, UA: NEGATIVE
Glucose, UA: NEGATIVE mg/dL
Ketones, POC UA: NEGATIVE mg/dL
Leukocytes, UA: NEGATIVE
Nitrite, UA: NEGATIVE
Protein Ur, POC: NEGATIVE mg/dL
Spec Grav, UA: 1.01 (ref 1.010–1.025)
Urobilinogen, UA: 0.2 E.U./dL
pH, UA: 6 (ref 5.0–8.0)

## 2019-03-30 MED ORDER — CYCLOBENZAPRINE HCL 10 MG PO TABS: 10.0000 mg | ORAL_TABLET | Freq: Three times a day (TID) | ORAL | 0 refills | Status: DC | PRN

## 2019-03-30 MED ORDER — KETOROLAC TROMETHAMINE 60 MG/2ML IM SOLN
60.0000 mg | Freq: Once | INTRAMUSCULAR | Status: AC
  Administered 2019-03-30: 13:00:00 60 mg via INTRAMUSCULAR

## 2019-03-30 MED ORDER — HYDROCODONE-ACETAMINOPHEN 5-325 MG PO TABS: 1.0000 | ORAL_TABLET | Freq: Four times a day (QID) | ORAL | 0 refills | Status: DC | PRN

## 2019-03-30 NOTE — Addendum Note (Signed)
Addended by: Roxanne Gates on: 03/30/2019 01:13 PM   Modules accepted: Orders

## 2019-03-30 NOTE — Assessment & Plan Note (Signed)
Suspect ms but pt was very concerned about a kidney stone Strain urine Ct abd/pelvis  Check labs  Pain med and muscle relaxer  Consider pred taper if ct neg for stone

## 2019-03-30 NOTE — Progress Notes (Signed)
Patient ID: Nicole Gray, female    DOB: [birth year]  Age: 59 y.o. MRN: [id number]    Subjective:  Subjective  HPI Nicole Gray presents for R sided low back pain since yesterday  No known injury--- pt R flank    She is concerned about a kidney stone because her husband and daughter have kidney stones.  Pain does not radiate down her leg  No fevers, no dysuria or urinary frequency.  No gross blood  She also noticed blood when she had a BM today -- she does have a hx of hemorrhoids but she has had some abd bloating.  Her last colonoscopy was 5 years ago and she had some polyps.  She will bring in the report in for [place]  Review of Systems  Constitutional: Negative for appetite change, diaphoresis, fatigue and unexpected weight change.  Eyes: Negative for pain, redness and visual disturbance.  Respiratory: Negative for cough, chest tightness, shortness of breath and wheezing.   Cardiovascular: Negative for chest pain, palpitations and leg swelling.  Gastrointestinal: Positive for abdominal distention and blood in stool. Negative for abdominal pain, constipation, diarrhea, nausea, rectal pain and vomiting.  Endocrine: Negative for cold intolerance, heat intolerance, polydipsia, polyphagia and polyuria.  Genitourinary: Positive for flank pain. Negative for difficulty urinating, dysuria, frequency, hematuria, vaginal bleeding, vaginal discharge and vaginal pain.  Musculoskeletal: Positive for back pain and gait problem.  Neurological: Negative for dizziness, light-headedness, numbness and headaches.    History Past Medical History:  Diagnosis Date  . Frozen shoulder   . Headache    migraines  . Osteoarthritis    AC  . Panic attacks   . PONV (postoperative nausea and vomiting)    " I had a  mild panic attack coming out of anesthesia"  . Wears glasses     She has a past surgical history that includes Tonsillectomy; Cyst excision; and Colonoscopy with esophagogastroduodenoscopy (egd).    Her family history includes Cancer in her mother; Gout in her father; Hypertension in her mother.She reports that she has never smoked. She has never used smokeless tobacco. She reports current alcohol use. She reports that she does not use drugs.  Current Outpatient Medications on File Prior to Visit  Medication Sig Dispense Refill  . ALPRAZolam (XANAX) 0.5 MG tablet Take 1 tablet (0.5 mg total) by mouth at bedtime as needed for anxiety. 30 tablet 0  . fluticasone (FLONASE) 50 MCG/ACT nasal spray Place 2 sprays into both nostrils daily. 16 g 6  . amoxicillin (AMOXIL) 875 MG tablet Take 1 tablet (875 mg total) by mouth 2 (two) times daily. (Patient not taking: Reported on 03/30/2019) 20 tablet 0  . promethazine-dextromethorphan (PROMETHAZINE-DM) 6.25-15 MG/5ML syrup Take 5 mLs by mouth 4 (four) times daily as needed. (Patient not taking: Reported on 03/30/2019) 118 mL 0   No current facility-administered medications on file prior to visit.     Objective:  Objective  Physical Exam Vitals and nursing note reviewed.  Constitutional:      Appearance: She is well-developed.  HENT:     Head: Normocephalic and atraumatic.  Eyes:     Conjunctiva/sclera: Conjunctivae normal.  Neck:     Thyroid: No thyromegaly.     Vascular: No carotid bruit or JVD.  Cardiovascular:     Rate and Rhythm: Normal rate and regular rhythm.     Heart sounds: Normal heart sounds. No murmur.  Pulmonary:     Effort: Pulmonary effort is normal. No respiratory distress.  Breath sounds: Normal breath sounds. No wheezing or rales.  Chest:     Chest wall: No tenderness.  Musculoskeletal:        General: Tenderness present.     Cervical back: Normal range of motion and neck supple.     Lumbar back: Spasms and tenderness present. Decreased range of motion. Negative right straight leg raise test and negative left straight leg raise test.       Back:  Neurological:     Mental Status: She is alert and oriented to  person, place, and time.     Sensory: No sensory deficit.     Motor: No weakness.     Coordination: Coordination normal.     Deep Tendon Reflexes: Reflexes normal.    BP 130/80 (BP Location: Left Arm, Patient Position: Sitting, Cuff Size: Normal)   Pulse 85   Temp 97.8 F (36.6 C) (Temporal)   Resp 18   Ht 5\' 3"  (1.6 m)   Wt 156 lb 12.8 oz (71.1 kg)   SpO2 97%   BMI 27.78 kg/m  Wt Readings from Last 3 Encounters:  03/30/19 156 lb 12.8 oz (71.1 kg)  11/01/17 151 lb (68.5 kg)  07/20/17 154 lb (69.9 kg)     Lab Results  Component Value Date   WBC 7.3 02/25/2016   HGB 13.8 02/25/2016   HCT 40.3 02/25/2016   PLT 286 02/25/2016   GLUCOSE 89 07/20/2017   CHOL 251 (H) 08/19/2017   TRIG 193.0 (H) 08/19/2017   HDL 46.80 08/19/2017   LDLDIRECT 208.0 07/20/2017   LDLCALC 166 (H) 08/19/2017   ALT 30 07/20/2017   AST 21 07/20/2017   NA 140 07/20/2017   K 4.7 07/20/2017   CL 103 07/20/2017   CREATININE 0.75 07/20/2017   BUN 14 07/20/2017   CO2 28 07/20/2017    No results found.   Assessment & Plan:  Plan  I am having Nicole Gray start on cyclobenzaprine and HYDROcodone-acetaminophen. I am also having her maintain her ALPRAZolam, fluticasone, amoxicillin, and promethazine-dextromethorphan.  Meds ordered this encounter  Medications  . cyclobenzaprine (FLEXERIL) 10 MG tablet    Sig: Take 1 tablet (10 mg total) by mouth 3 (three) times daily as needed for muscle spasms.    Dispense:  30 tablet    Refill:  0  . HYDROcodone-acetaminophen (NORCO/VICODIN) 5-325 MG tablet    Sig: Take 1 tablet by mouth every 6 (six) hours as needed for moderate pain.    Dispense:  30 tablet    Refill:  0    Problem List Items Addressed This Visit      Unprioritized   Acute back pain - Primary   Relevant Medications   cyclobenzaprine (FLEXERIL) 10 MG tablet   HYDROcodone-acetaminophen (NORCO/VICODIN) 5-325 MG tablet   Other Relevant Orders   POCT urinalysis dipstick (Completed)   CBC  with Differential/Platelet   Comprehensive metabolic panel   Blood per rectum    Pt given I fob Refer to GI  Pt will get a copy of colon to [place]       Relevant Orders   Ambulatory referral to Gastroenterology   CBC with Differential/Platelet   Comprehensive metabolic panel   Right flank pain    Suspect ms but pt was very concerned about a kidney stone Strain urine Ct abd/pelvis  Check labs  Pain med and muscle relaxer  Consider pred taper if ct neg for stone       Relevant Orders   CT RENAL  STONE STUDY   Right upper quadrant abdominal pain   Relevant Orders   CT RENAL STONE STUDY      Follow-up: Return if symptoms worsen or fail to improve.  Donato Schultz, DO

## 2019-03-30 NOTE — Assessment & Plan Note (Signed)
Pt given I fob Refer to GI  Pt will get a copy of colon to [place]

## 2019-03-30 NOTE — Patient Instructions (Signed)
Acute Back Pain, Adult Acute back pain is sudden and usually short-lived. It is often caused by an injury to the muscles and tissues in the back. The injury may result from:  A muscle or ligament getting overstretched or torn (strained). Ligaments are tissues that connect bones to each other. Lifting something improperly can cause a back strain.  Wear and tear (degeneration) of the spinal disks. Spinal disks are circular tissue that provides cushioning between the bones of the spine (vertebrae).  Twisting motions, such as while playing sports or doing yard work.  A hit to the back.  Arthritis. You may have a physical exam, lab tests, and imaging tests to find the cause of your pain. Acute back pain usually goes away with rest and home care. Follow these instructions at home: Managing pain, stiffness, and swelling  Take over-the-counter and prescription medicines only as told by your health care provider.  Your health care provider may recommend applying ice during the first 24-48 hours after your pain starts. To do this: ? Put ice in a plastic bag. ? Place a towel between your skin and the bag. ? Leave the ice on for 20 minutes, 2-3 times a day.  If directed, apply heat to the affected area as often as told by your health care provider. Use the heat source that your health care provider recommends, such as a moist heat pack or a heating pad. ? Place a towel between your skin and the heat source. ? Leave the heat on for 20-30 minutes. ? Remove the heat if your skin turns bright red. This is especially important if you are unable to feel pain, heat, or cold. You have a greater risk of getting burned. Activity   Do not stay in bed. Staying in bed for more than 1-2 days can delay your recovery.  Sit up and stand up straight. Avoid leaning forward when you sit, or hunching over when you stand. ? If you work at a desk, sit close to it so you do not need to lean over. Keep your chin tucked  in. Keep your neck drawn back, and keep your elbows bent at a right angle. Your arms should look like the letter "L." ? Sit high and close to the steering wheel when you drive. Add lower back (lumbar) support to your car seat, if needed.  Take short walks on even surfaces as soon as you are able. Try to increase the length of time you walk each day.  Do not sit, drive, or stand in one place for more than 30 minutes at a time. Sitting or standing for long periods of time can put stress on your back.  Do not drive or use heavy machinery while taking prescription pain medicine.  Use proper lifting techniques. When you bend and lift, use positions that put less stress on your back: ? Bend your knees. ? Keep the load close to your body. ? Avoid twisting.  Exercise regularly as told by your health care provider. Exercising helps your back heal faster and helps prevent back injuries by keeping muscles strong and flexible.  Work with a physical therapist to make a safe exercise program, as recommended by your health care provider. Do any exercises as told by your physical therapist. Lifestyle  Maintain a healthy weight. Extra weight puts stress on your back and makes it difficult to have good posture.  Avoid activities or situations that make you feel anxious or stressed. Stress and anxiety increase muscle   tension and can make back pain worse. Learn ways to manage anxiety and stress, such as through exercise. General instructions  Sleep on a firm mattress in a comfortable position. Try lying on your side with your knees slightly bent. If you lie on your back, put a pillow under your knees.  Follow your treatment plan as told by your health care provider. This may include: ? Cognitive or behavioral therapy. ? Acupuncture or massage therapy. ? Meditation or yoga. Contact a health care provider if:  You have pain that is not relieved with rest or medicine.  You have increasing pain going down  into your legs or buttocks.  Your pain does not improve after 2 weeks.  You have pain at night.  You lose weight without trying.  You have a fever or chills. Get help right away if:  You develop new bowel or bladder control problems.  You have unusual weakness or numbness in your arms or legs.  You develop nausea or vomiting.  You develop abdominal pain.  You feel faint. Summary  Acute back pain is sudden and usually short-lived.  Use proper lifting techniques. When you bend and lift, use positions that put less stress on your back.  Take over-the-counter and prescription medicines and apply heat or ice as directed by your health care provider. This information is not intended to replace advice given to you by your health care provider. Make sure you discuss any questions you have with your health care provider. Document Revised: 05/30/2018 Document Reviewed: 09/22/2016 Elsevier Patient Education  2020 [employer]  

## 2019-03-31 ENCOUNTER — Other Ambulatory Visit: Payer: Self-pay | Admitting: Family Medicine

## 2019-03-31 DIAGNOSIS — M5441 Lumbago with sciatica, right side: Secondary | ICD-10-CM

## 2019-03-31 MED ORDER — PREDNISONE 10 MG PO TABS: ORAL_TABLET | ORAL | 0 refills | Status: DC

## 2019-04-19 ENCOUNTER — Inpatient Hospital Stay: Admission: RE | Admit: 2019-04-19 | Payer: Commercial Managed Care - PPO | Source: Ambulatory Visit

## 2019-04-19 ENCOUNTER — Other Ambulatory Visit: Payer: Self-pay

## 2019-04-19 ENCOUNTER — Ambulatory Visit (INDEPENDENT_AMBULATORY_CARE_PROVIDER_SITE_OTHER)
Admission: RE | Admit: 2019-04-19 | Discharge: 2019-04-19 | Disposition: A | Discharge status: Home / Self Care | Payer: Commercial Managed Care - PPO | Source: Ambulatory Visit

## 2019-04-19 DIAGNOSIS — M5441 Lumbago with sciatica, right side: Secondary | ICD-10-CM

## 2019-04-19 MED ORDER — TIZANIDINE HCL 2 MG PO CAPS: 2.0000 mg | ORAL_CAPSULE | Freq: Three times a day (TID) | ORAL | 0 refills | Status: DC

## 2019-04-19 MED ORDER — PREDNISONE 20 MG PO TABS: 20.0000 mg | ORAL_TABLET | Freq: Two times a day (BID) | ORAL | 0 refills | Status: AC

## 2019-04-19 MED ORDER — KETOROLAC TROMETHAMINE 10 MG PO TABS: 10.0000 mg | ORAL_TABLET | Freq: Two times a day (BID) | ORAL | 0 refills | Status: DC | PRN

## 2019-04-19 NOTE — ED Provider Notes (Signed)
[facility] Virtual Visit via Video Note:  Gianah Batt  initiated request for Telemedicine visit with [facility] Urgent [facility] team. I connected with Nena Polio  on 04/19/2019 at 11:50 AM  for a synchronized telemedicine visit using a video enabled HIPPA compliant telemedicine application. I verified that I am speaking with Nena Polio  using two identifiers. Rennis Harding, PA-C  was physically located in a [facility] Urgent care site and Shatasha Lambing was located at a different location.   The limitations of evaluation and management by telemedicine as well as the availability of in-person appointments were discussed. Patient was informed that she  may incur a bill ( including co-pay) for this virtual visit encounter. Tyger Oka  expressed understanding and gave verbal consent to proceed with virtual visit.   [id number] 04/19/19 Arrival Time: 1129  CC: Back PAIN  SUBJECTIVE: History from: patient. Nicole Gray is a 59 y.o. female complains of acute on chronic RT low back pain that began 3 days ago.  Denies a precipitating event or specific injury.  Localizes the pain to the RT low back.  Describes the pain as constant and "pulling" in character.  Symptoms are made worse with sitting and walking.  Was seen for similar symptoms on 03/29/30 and prescribed flexeril, norco, and prednisone taper.  Reports minimal relief, and then flare-up 3 days ago.  Had CT renal study done by PCP that did not show kidney stones.  Complains of RT sided weakness and numbness/ tingling.  Reports difficulty with walking and standing, but able ambulate.  Denies fever, chills, nausea, vomiting, erythema, ecchymosis, effusion, saddle paresthesias, loss of bowel or bladder function.      ROS: As per HPI.  All other pertinent ROS negative.     Past Medical History:  Diagnosis Date  . Frozen shoulder   . Headache    migraines  . Osteoarthritis    AC  . Panic attacks   . PONV (postoperative nausea and  vomiting)    " I had a  mild panic attack coming out of anesthesia"  . Wears glasses    Past Surgical History:  Procedure Laterality Date  . COLONOSCOPY WITH ESOPHAGOGASTRODUODENOSCOPY (EGD)    . CYST EXCISION    . TONSILLECTOMY     No Known Allergies No current facility-administered medications on file prior to encounter.   Current Outpatient Medications on File Prior to Encounter  Medication Sig Dispense Refill  . ALPRAZolam (XANAX) 0.5 MG tablet Take 1 tablet (0.5 mg total) by mouth at bedtime as needed for anxiety. 30 tablet 0  . fluticasone (FLONASE) 50 MCG/ACT nasal spray Place 2 sprays into both nostrils daily. 16 g 6  . HYDROcodone-acetaminophen (NORCO/VICODIN) 5-325 MG tablet Take 1 tablet by mouth every 6 (six) hours as needed for moderate pain. 30 tablet 0    OBJECTIVE:  There were no vitals filed for this visit.  General appearance: alert; appears uncomfortable, but not in acute distress Eyes: EOMI grossly HENT: normocephalic; atraumatic Neck: supple with FROM Lungs: normal respiratory effort; speaking in full sentences without difficulty Extremities: moves extremities without difficulty Skin: No obvious rashes Neurologic: No facial asymmetries; ambulates with difficulty Psychological: alert and cooperative; normal mood and affect   ASSESSMENT & PLAN:  1. Acute right-sided low back pain with right-sided sciatica    Meds ordered this encounter  Medications  . predniSONE (DELTASONE) 20 MG tablet    Sig: Take 1 tablet (20 mg total) by mouth 2 (two) times daily with  a meal for 5 days.    Dispense:  10 tablet    Refill:  0    Order Specific Question:   Supervising Provider    Answer:   Raylene Everts [[id number]]  . ketorolac (TORADOL) 10 MG tablet    Sig: Take 1 tablet (10 mg total) by mouth every 12 (twelve) hours as needed for severe pain.    Dispense:  10 tablet    Refill:  0    Order Specific Question:   Supervising Provider    Answer:   Raylene Everts [[id number]]  . tizanidine (ZANAFLEX) 2 MG capsule    Sig: Take 1 capsule (2 mg total) by mouth 3 (three) times daily.    Dispense:  15 capsule    Refill:  0    Order Specific Question:   Supervising Provider    Answer:   Raylene Everts [[id number]]   Offered further evaluation and management in the ED for persistent, severe low back pain.  Declines at this time.  Would like to try outpatient therapy first Continue conservative management of rest, ice, heat, and gentle stretches/ massages Take toradol as needed for pain relief (may cause abdominal discomfort, ulcers, and GI bleeds avoid taking with other NSAIDs) Prednisone prescribed.  Take as directed and to completion Take zanaflex at nighttime for symptomatic relief. Avoid driving or operating heavy machinery while using medication. Go to the ER if you have any new or worsening symptoms (fever, chills, chest pain, abdominal pain, changes in bowel or bladder habits, pain radiating into lower legs, pain does not improve with medications, etc...)   I discussed the assessment and treatment plan with the patient. The patient was provided an opportunity to ask questions and all were answered. The patient agreed with the plan and demonstrated an understanding of the instructions.   The patient was advised to call back or seek an in-person evaluation if the symptoms worsen or if the condition fails to improve as anticipated.  I provided 16 minutes of non-face-to-face time during this encounter.  Lestine Box, PA-C  04/19/2019 11:50 AM     Lestine Box, PA-C 04/19/19 1154

## 2019-04-19 NOTE — Discharge Instructions (Addendum)
Offered further evaluation and management in the ED for persistent, severe low back pain.  Declines at this time.  Would like to try outpatient therapy first Continue conservative management of rest, ice, heat, and gentle stretches/ massages Take toradol as needed for pain relief (may cause abdominal discomfort, ulcers, and GI bleeds avoid taking with other NSAIDs) Prednisone prescribed.  Take as directed and to completion Take zanaflex at nighttime for symptomatic relief. Avoid driving or operating heavy machinery while using medication. Go to the ER if you have any new or worsening symptoms (fever, chills, chest pain, abdominal pain, changes in bowel or bladder habits, pain radiating into lower legs, pain does not improve with medications, etc...)

## 2019-06-27 ENCOUNTER — Encounter: Payer: Self-pay | Admitting: Family Medicine

## 2020-05-30 IMAGING — CT CT RENAL STONE PROTOCOL
2 of 4 series · 17 of 46 positions shown, 19 images · non-contrast
Comparison: None.

CLINICAL DATA: Right flank pain.

EXAM:
CT ABDOMEN AND PELVIS WITHOUT CONTRAST
TECHNIQUE: Multidetector CT imaging of the abdomen and pelvis was performed
following the standard protocol without IV contrast.

[Series 2: axial st · axial · 0.97mm/px · z∈[-441,-51]mm · 14 of 86 slices shown, 16 images]
[im 4/86  soft-tissue]
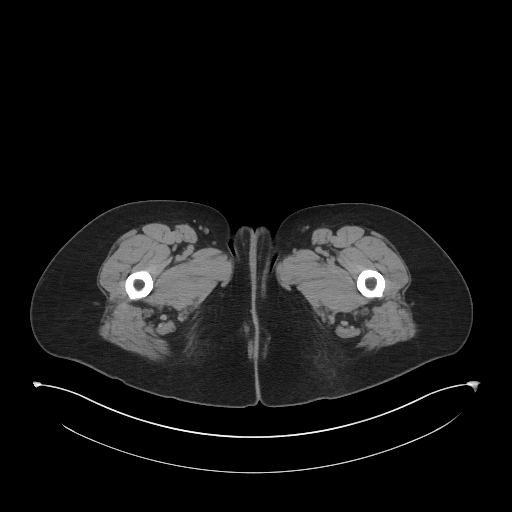
[im 4/86  bone]
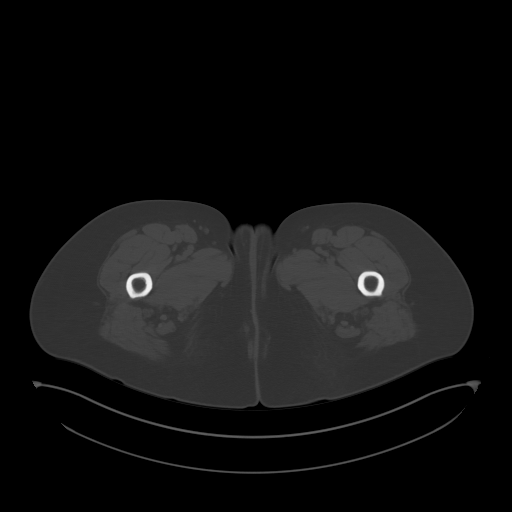
[im 10/86  soft-tissue]
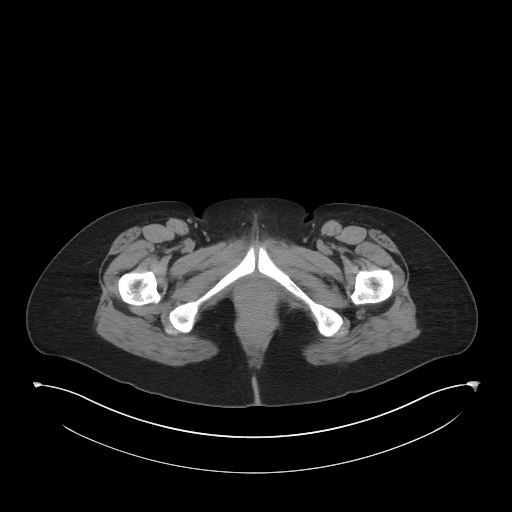
[im 16/86  soft-tissue]
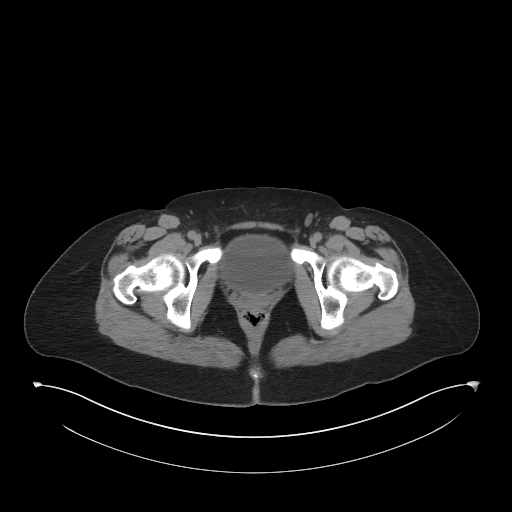
[im 23/86  soft-tissue]
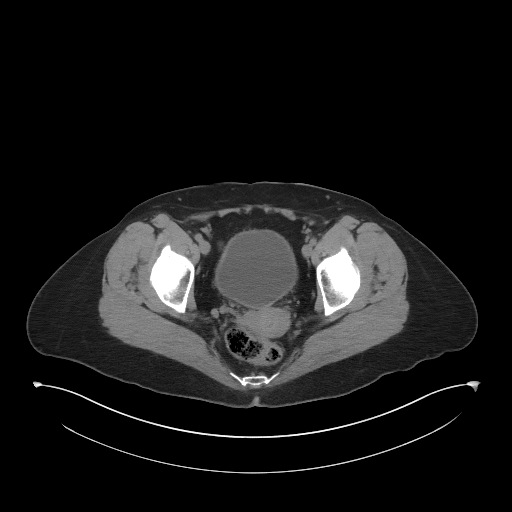
[im 29/86  soft-tissue]
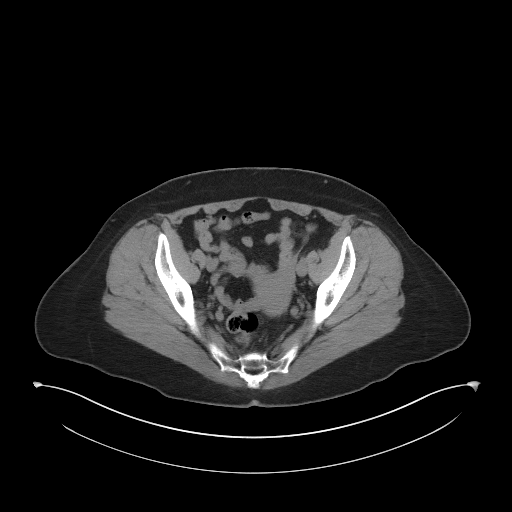
[im 35/86  soft-tissue]
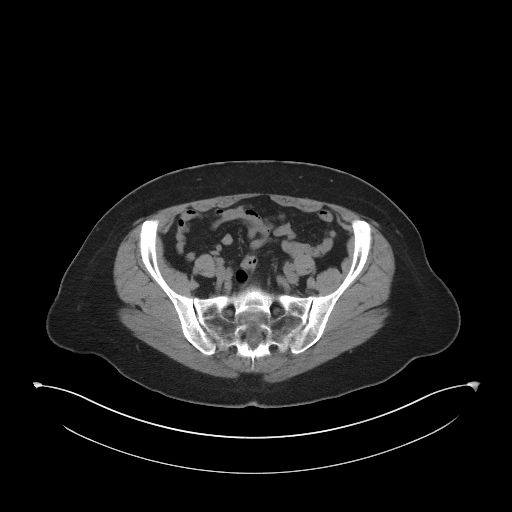
[im 41/86  soft-tissue]
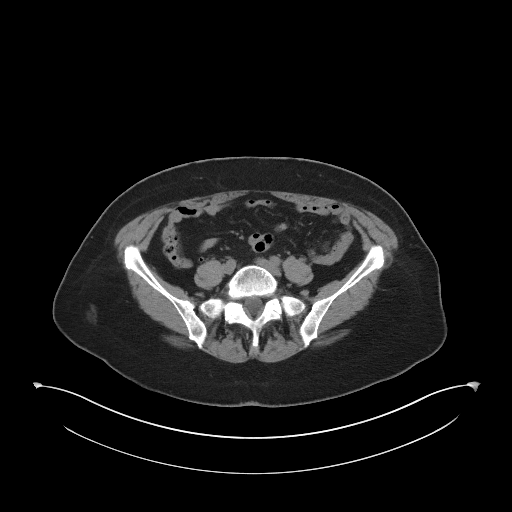
[im 45/86  soft-tissue]
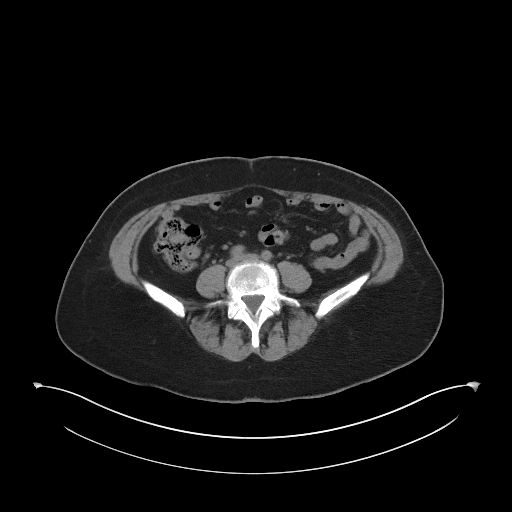
[im 51/86  soft-tissue]
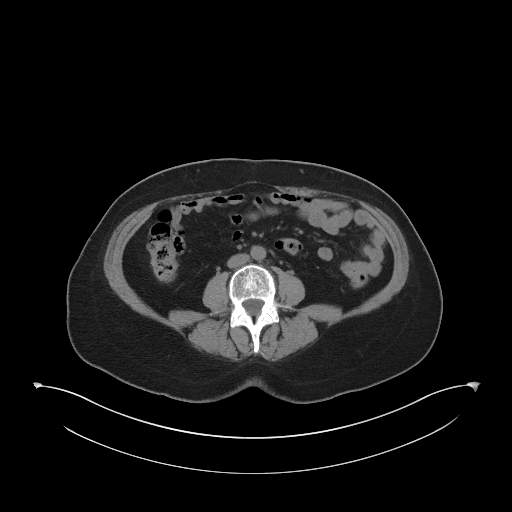
[im 51/86  bone]
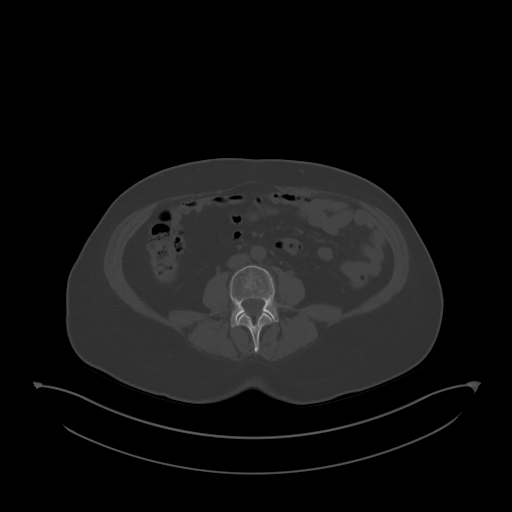
[im 57/86  soft-tissue]
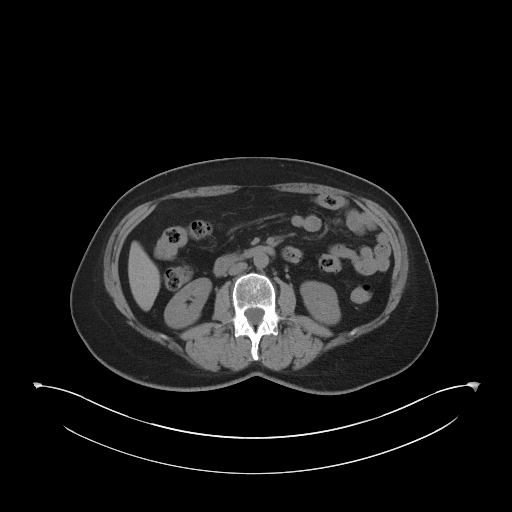
[im 63/86  soft-tissue]
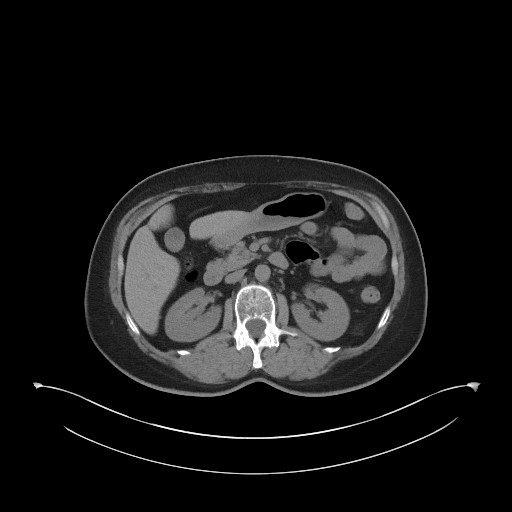
[im 70/86  soft-tissue]
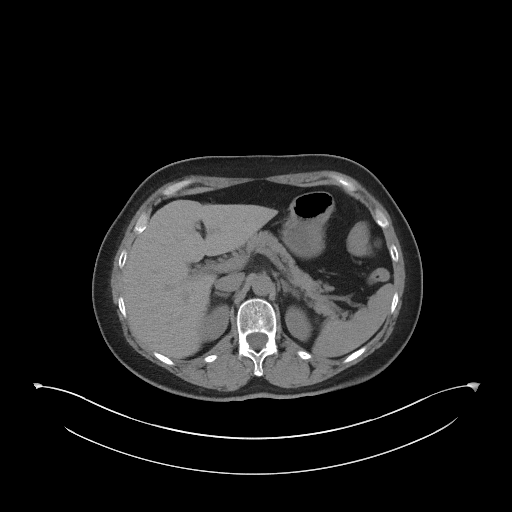
[im 76/86  soft-tissue]
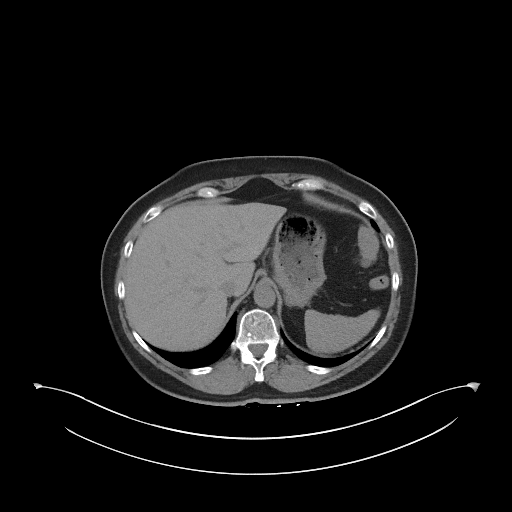
[im 82/86  soft-tissue]
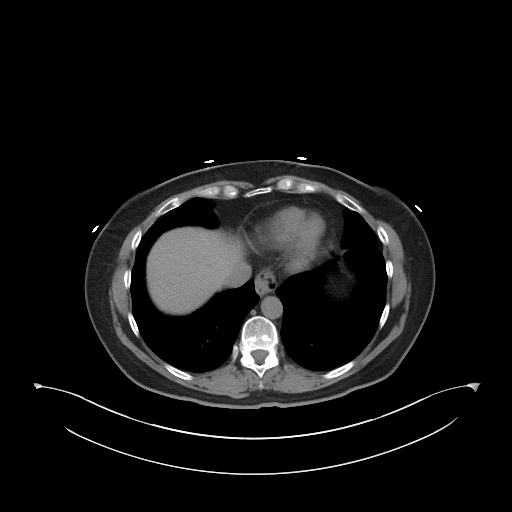

[Series 5: coronal st · coronal · 0.81mm/px · 3 of 84 slices shown]
[im 28/84  soft-tissue]
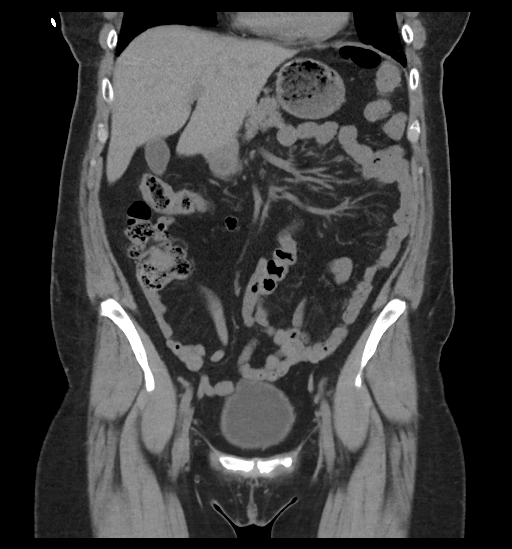
[im 37/84  soft-tissue]
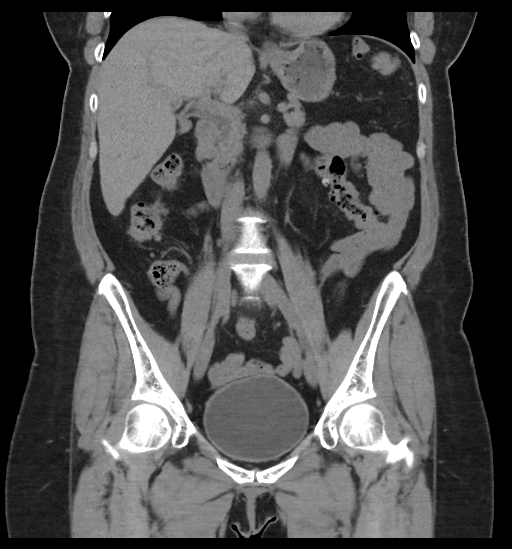
[im 47/84  soft-tissue]
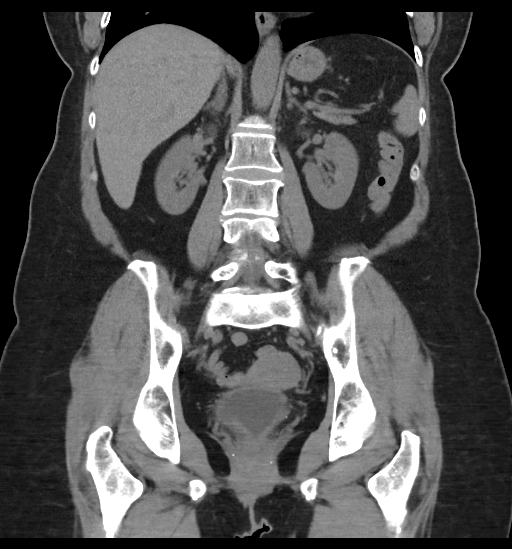

[17 of 46 positions shown; findings below may reference images not displayed]

FINDINGS: Lower chest: No acute abnormality.

Hepatobiliary: No focal liver abnormality is seen. No gallstones,
gallbladder wall thickening, or biliary dilatation.

Pancreas: Unremarkable. No pancreatic ductal dilatation or
surrounding inflammatory changes.

Spleen: Normal in size without focal abnormality.

Adrenals/Urinary Tract: Adrenal glands are unremarkable. Kidneys are
normal, without renal calculi, focal lesion, or hydronephrosis.
Bladder is unremarkable.

Stomach/Bowel: There is a small hiatal hernia. Appendix appears
normal. No evidence of bowel wall thickening, distention, or
inflammatory changes.

Vascular/Lymphatic: No significant vascular findings are present. No
enlarged abdominal or pelvic lymph nodes.

Reproductive: Uterus and bilateral adnexa are unremarkable.

Other: No abdominal wall hernia or abnormality. No abdominopelvic
ascites.

Musculoskeletal: No acute or significant osseous findings.
IMPRESSION: 1. Small hiatal hernia.
2. No evidence of renal calculi.

## 2020-09-17 ENCOUNTER — Ambulatory Visit: Payer: Commercial Managed Care - PPO | Attending: Internal Medicine

## 2020-09-17 DIAGNOSIS — Z23 Encounter for immunization: Secondary | ICD-10-CM

## 2020-09-17 NOTE — Progress Notes (Signed)
   Covid-19 Vaccination Clinic  Name:  Mahiya Kercheval    MRN: [id number] DOB: 1960/07/07  09/17/2020  Ms. Genco was observed post Covid-19 immunization for 15 minutes without incident. She was provided with Vaccine Information Sheet and instruction to access the V-Safe system.   Ms. Dieujuste was instructed to call 911 with any severe reactions post vaccine: Difficulty breathing  Swelling of face and throat  A fast heartbeat  A bad rash all over body  Dizziness and weakness   Immunizations Administered     Name Date Dose VIS Date Route   PFIZER Comrnaty(Gray TOP) Covid-19 Vaccine 09/17/2020 10:45 AM 0.3 mL 01/31/2020 Intramuscular   Manufacturer: [employer], [employer]   Lot: [id number]   NDC: [id number]

## 2020-09-23 ENCOUNTER — Other Ambulatory Visit (HOSPITAL_BASED_OUTPATIENT_CLINIC_OR_DEPARTMENT_OTHER): Payer: Self-pay

## 2020-09-23 MED ORDER — COVID-19 MRNA VAC-TRIS(PFIZER) 30 MCG/0.3ML IM SUSP
INTRAMUSCULAR | 0 refills | Status: DC
  Filled 2020-09-23: qty 0.3, 1d supply, fill #0

## 2021-03-02 ENCOUNTER — Other Ambulatory Visit: Payer: Self-pay | Admitting: Family Medicine

## 2021-03-02 DIAGNOSIS — F410 Panic disorder [episodic paroxysmal anxiety] without agoraphobia: Secondary | ICD-10-CM

## 2021-03-06 ENCOUNTER — Other Ambulatory Visit: Payer: Self-pay | Admitting: Family Medicine

## 2021-03-06 ENCOUNTER — Telehealth: Payer: Self-pay | Admitting: Family Medicine

## 2021-03-06 MED ORDER — HYDROXYZINE PAMOATE 25 MG PO CAPS: 25.0000 mg | ORAL_CAPSULE | Freq: Three times a day (TID) | ORAL | 0 refills | Status: AC | PRN

## 2021-03-06 NOTE — Telephone Encounter (Signed)
Pt stated Dr. Etter Sjogren prevs prescribed meds for anxiety. Pt would like to know if meds can be prescribed again. Appt sched 1/16 @ 1015. Please advise.

## 2021-03-09 ENCOUNTER — Telehealth: Payer: Commercial Managed Care - PPO | Admitting: Family Medicine

## 2021-03-09 ENCOUNTER — Encounter: Payer: Self-pay | Admitting: Family Medicine

## 2021-03-09 DIAGNOSIS — F418 Other specified anxiety disorders: Secondary | ICD-10-CM

## 2021-03-09 MED ORDER — BUSPIRONE HCL 7.5 MG PO TABS: 7.5000 mg | ORAL_TABLET | Freq: Two times a day (BID) | ORAL | 0 refills | Status: DC

## 2021-03-09 NOTE — Progress Notes (Signed)
Chief Complaint  Patient presents with   Anxiety    Subjective: Patient is a 61 y.o. female here for anxiety. Due to COVID-19 pandemic, we are interacting via web portal for an electronic face-to-face visit. I verified patient's ID using 2 identifiers. Patient agreed to proceed with visit via this method. Patient is at home, I am at office. Patient and I are present for visit.   On Christmas Eve the patient's dogs were fighting and got overly aggressive.  1 bit her hand repeatedly.  The next day it was very swollen and she had red streaks extending from her finger.  She went to the emergency department and was admitted at [facility].  She saw the orthopedic team and had an MRI done.  The MRI showed osteomyelitis versus bone contusion.  The Ortho team thought it was a bone contusion well the infectious disease team thought it was osteomyelitis.  The latter team thought it should be treated with 6 weeks of IV antibiotics and to insert a PICC line.  Patient was reasonably quite confused over what exactly to do.  She eventually got the antibiotics but was not thrilled with the potential side effects.  This was causing her a lot of anxiety.  She is interested in starting a daily medication in addition to something to take as needed and would prefer to avoid anything addictive.  She thinks this will get better once she stops taking the antibiotics, she recently started week 4.  Past Medical History:  Diagnosis Date   Frozen shoulder    Headache    migraines   Osteoarthritis    AC   Panic attacks    PONV (postoperative nausea and vomiting)    " I had a  mild panic attack coming out of anesthesia"   Wears glasses     Objective: No conversational dyspnea Age appropriate judgment and insight Nml affect and mood  Assessment and Plan: Situational anxiety - Plan: busPIRone (BUSPAR) 7.5 MG tablet  Start buspirone 7.5 mg twice daily.  Continue hydroxyzine 25-75 mg every 8 hours as needed.  I doubt  she will need this long-term which she is in agreement with.  I am okay to send in a short course of alprazolam given the short-term nature of this issue as well.  Follow-up as originally scheduled. The patient voiced understanding and agreement to the plan.  Jilda Roche [place], DO 03/09/21  11:18 AM

## 2021-03-13 ENCOUNTER — Encounter: Payer: Self-pay | Admitting: Family Medicine

## 2021-03-13 ENCOUNTER — Other Ambulatory Visit: Payer: Self-pay | Admitting: Internal Medicine

## 2021-03-13 DIAGNOSIS — F418 Other specified anxiety disorders: Secondary | ICD-10-CM

## 2021-03-13 MED ORDER — ALPRAZOLAM 0.5 MG PO TABS: 0.2500 mg | ORAL_TABLET | Freq: Two times a day (BID) | ORAL | 0 refills | Status: AC | PRN

## 2021-03-24 ENCOUNTER — Telehealth: Payer: Self-pay

## 2021-03-24 NOTE — Telephone Encounter (Signed)
Pt called stating she had been prescribed an antibiotic for a bone infection and was taking it for 4 weeks.  She stated she was also prescribed something for anxiety during this time and that has now resolved itself.  Her stomach needs to get balanced from being on the antibiotic for so long. Pt does not know if she needs to see Dr. Carmelia Roller for this or if she needs a referral to GI.  Please advise.

## 2021-03-24 NOTE — Telephone Encounter (Signed)
Called and scheduled with PCP 03/25/21.

## 2021-03-25 ENCOUNTER — Ambulatory Visit: Payer: Commercial Managed Care - PPO | Admitting: Family Medicine

## 2021-03-25 ENCOUNTER — Encounter: Payer: Self-pay | Admitting: Family Medicine

## 2021-03-25 VITALS — BP 110/62 | HR 77 | Temp 98.0°F | Ht 63.0 in | Wt 147.2 lb

## 2021-03-25 DIAGNOSIS — R152 Fecal urgency: Secondary | ICD-10-CM

## 2021-03-25 DIAGNOSIS — K521 Toxic gastroenteritis and colitis: Secondary | ICD-10-CM

## 2021-03-25 DIAGNOSIS — T3695XA Adverse effect of unspecified systemic antibiotic, initial encounter: Secondary | ICD-10-CM

## 2021-03-25 DIAGNOSIS — R112 Nausea with vomiting, unspecified: Secondary | ICD-10-CM | POA: Diagnosis not present

## 2021-03-25 MED ORDER — ONDANSETRON 4 MG PO TBDP: 4.0000 mg | ORAL_TABLET | Freq: Three times a day (TID) | ORAL | 0 refills | Status: AC | PRN

## 2021-03-25 NOTE — Patient Instructions (Addendum)
Continue taking a probiotic.  Take Metamucil or Benefiber daily.   Stay hydrated.   We will be in touch regarding your stool results.   Let us know if you need anything.

## 2021-03-25 NOTE — Progress Notes (Signed)
Chief Complaint  Patient presents with   GI Problem    On antibiotics for 4 weeks. Nausea     Subjective: Patient is a 61 y.o. female here for GI issue.  Patient came off of 4 weeks of IV antibiotics from a dog bite thought to have been causing osteomyelitis.  She was on Flagyl and Rocephin.  Last dose was on 1/22.  Since that time, she has had frequent and loose stools in addition to nausea and vomiting.  Eating makes things worse.  She is not having any abdominal pain, fevers, bleeding, or mucus.  She has been taking a probiotic.  She was given some nausea medication in the hospital which helped a little bit.  She has not been eating and drinking as much as she normally does.  She has been weak and has lost 10 pounds thus far.  Past Medical History:  Diagnosis Date   Frozen shoulder    Headache    migraines   Osteoarthritis    AC   Panic attacks    PONV (postoperative nausea and vomiting)    " I had a  mild panic attack coming out of anesthesia"   Wears glasses     Objective: BP 110/62    Pulse 77    Temp 98 F (36.7 C) (Oral)    Ht 5\' 3"  (1.6 m)    Wt 147 lb 4 oz (66.8 kg)    SpO2 97%    BMI 26.08 kg/m  General: Awake, appears stated age Heart: RRR, no LE edema Lungs: CTAB, no rales, wheezes or rhonchi. No accessory muscle use Abdomen: Bowel sounds present, soft, nontender, nondistended, no masses or organomegaly Psych: Age appropriate judgment and insight, normal affect and mood  Assessment and Plan: Fecal urgency  Nausea and vomiting, unspecified vomiting type - Plan: ondansetron (ZOFRAN-ODT) 4 MG disintegrating tablet  Antibiotic-associated diarrhea - Plan: Clostridium difficile Toxin B, Qualitative, Real-Time PCR([facility]), GI Profile, Stool, PCR, CANCELED: GI Profile, Stool, PCR, CANCELED: Clostridium difficile Toxin B, Qualitative, Real-Time PCR([facility])  New problems with uncertain prognosis.  Likely component of adverse effects from antibiotics.  We will check stool  culture and rule out C. difficile.  Continue probiotic.  Add fiber supplement.  Stay hydrated.  Zofran as needed for nausea.  Weakness is almost definitely due to lack of calories.  She has lost 10 pounds. The patient voiced understanding and agreement to the plan.  [place], DO 03/25/21  11:58 AM

## 2021-03-27 ENCOUNTER — Ambulatory Visit: Payer: Commercial Managed Care - PPO | Admitting: Family Medicine

## 2021-03-27 ENCOUNTER — Encounter: Payer: Self-pay | Admitting: Family Medicine

## 2021-03-27 ENCOUNTER — Other Ambulatory Visit (INDEPENDENT_AMBULATORY_CARE_PROVIDER_SITE_OTHER): Payer: Commercial Managed Care - PPO

## 2021-03-27 DIAGNOSIS — K521 Toxic gastroenteritis and colitis: Secondary | ICD-10-CM | POA: Diagnosis not present

## 2021-03-27 DIAGNOSIS — T3695XA Adverse effect of unspecified systemic antibiotic, initial encounter: Secondary | ICD-10-CM | POA: Diagnosis not present

## 2021-03-30 LAB — GI PROFILE, STOOL, PCR

## 2021-03-30 LAB — CLOSTRIDIUM DIFFICILE TOXIN B, QUALITATIVE, REAL-TIME PCR: Toxigenic C. Difficile by PCR: NOT DETECTED

## 2021-03-31 ENCOUNTER — Other Ambulatory Visit: Payer: Self-pay | Admitting: Family Medicine

## 2021-03-31 DIAGNOSIS — F418 Other specified anxiety disorders: Secondary | ICD-10-CM

## 2021-04-14 NOTE — Telephone Encounter (Signed)
Opened in error

## 2021-09-01 ENCOUNTER — Encounter: Payer: Self-pay | Admitting: Family Medicine

## 2021-09-01 ENCOUNTER — Telehealth: Payer: Self-pay

## 2021-09-01 ENCOUNTER — Telehealth: Payer: Commercial Managed Care - PPO | Admitting: Family Medicine

## 2021-09-01 DIAGNOSIS — M545 Low back pain, unspecified: Secondary | ICD-10-CM | POA: Diagnosis not present

## 2021-09-01 MED ORDER — TIZANIDINE HCL 4 MG PO TABS: 4.0000 mg | ORAL_TABLET | Freq: Four times a day (QID) | ORAL | 0 refills | Status: AC | PRN

## 2021-09-01 MED ORDER — PREDNISONE 20 MG PO TABS: 40.0000 mg | ORAL_TABLET | Freq: Every day | ORAL | 0 refills | Status: AC

## 2021-09-01 NOTE — Telephone Encounter (Signed)
Called the patient informed she would need appointment. Scheduled a video visit today at 11:45.

## 2021-09-01 NOTE — Progress Notes (Signed)
Musculoskeletal Exam  Patient: Nicole Gray DOB: 26-Oct-1960  DOS: 09/01/2021  SUBJECTIVE:  Chief Complaint:   Chief Complaint  Patient presents with   Nerve pain    X Sunday.    Pain    Nicole Gray is a 61 y.o.  female for evaluation and treatment of back pain.  Due to COVID-19 pandemic, we are interacting via web portal for an electronic face-to-face visit. I verified patient's ID using 2 identifiers. Patient agreed to proceed with visit via this method. Patient is at home, I am at office. Patient and I are present for visit.   Onset:  1 day ago. No inj or change in activity.  Location: lower L Character:  aching and sharp  Progression of issue:  is unchanged Associated symptoms: Shoots up to her head Denies bowel/bladder incontinence or weakness Treatment: to date has been OTC NSAIDS, muscle relaxers, topical menthol, opiates.   Neurovascular symptoms: no  Past Medical History:  Diagnosis Date   Frozen shoulder    Headache    migraines   Osteoarthritis    AC   Panic attacks    PONV (postoperative nausea and vomiting)    " I had a  mild panic attack coming out of anesthesia"   Wears glasses     Objective: No conversational dyspnea Age appropriate judgment and insight Nml affect and mood  Assessment:  Acute left-sided low back pain without sciatica - Plan: predniSONE (DELTASONE) 20 MG tablet, tiZANidine (ZANAFLEX) 4 MG tablet  Plan: Stretches/exercises, heat, ice, Tylenol, 5 d pred burst, Zanaflex. F/u prn. The patient voiced understanding and agreement to the plan.   Nicole Roche [place], DO 09/01/21  12:13 PM

## 2021-09-01 NOTE — Telephone Encounter (Signed)
Pt called asking for a medication for a pinched nerve, lower back pain to be sent in to [facility] [employer] [employer]. She is not able to get out of bed. She says she had this same issue in 2021. Please advise.

## 2021-11-06 ENCOUNTER — Telehealth (INDEPENDENT_AMBULATORY_CARE_PROVIDER_SITE_OTHER): Payer: Commercial Managed Care - PPO | Admitting: Family Medicine

## 2021-11-06 ENCOUNTER — Encounter: Payer: Self-pay | Admitting: Family Medicine

## 2021-11-06 DIAGNOSIS — U071 COVID-19: Secondary | ICD-10-CM

## 2021-11-06 MED ORDER — BENZONATATE 200 MG PO CAPS: 200.0000 mg | ORAL_CAPSULE | Freq: Two times a day (BID) | ORAL | 0 refills | Status: AC | PRN

## 2021-11-06 MED ORDER — MOLNUPIRAVIR EUA 200MG CAPSULE: 4.0000 | ORAL_CAPSULE | Freq: Two times a day (BID) | ORAL | 0 refills | Status: AC

## 2021-11-06 NOTE — Progress Notes (Signed)
VIRTUAL VISIT VIA VIDEO  I connected with Nicole Gray on 11/06/21 at 11:00 AM EDT by a video enabled telemedicine application and verified that I am speaking with the correct person using two identifiers. Location patient: Home Location provider: [facility], Office Persons participating in the virtual visit: Patient, Dr. Claiborne Billings and Les Pou, CMA  I discussed the limitations of evaluation and management by telemedicine and the availability of in person appointments. The patient expressed understanding and agreed to proceed.    Nicole Gray , Aug 14, 1960, 61 y.o., female MRN: [id number] Patient Care Team    Relationship Specialty Notifications Start End  Sharlene Dory, [place] PCP - General Family Medicine  06/01/17     Chief Complaint  Patient presents with   Covid Positive    Pt c/o body aches, HA, sore throat, sinus pain x 1 day; tested pos today     Subjective: Pt presents for an OV with complaints of covid 19 illness of 1 day duration.  Reports she tested positive today for COVID-19.  Her daughter had gone to [place] and came home with COVID.  Denies shortness of breath.  Patient endorses body aches, headache, sore throat, sinus pain. She has been vaccinated.  Normal GFR No PMH lung disease, nonsmoker Reports she took Tylenol and naproxen for the body aches.     03/09/2021   10:34 AM  Depression screen PHQ 2/9  Decreased Interest 0  Down, Depressed, Hopeless 0  PHQ - 2 Score 0    Allergies  Allergen Reactions   Flagyl [Metronidazole] Diarrhea   Social History   Social History Narrative   Not on file   Past Medical History:  Diagnosis Date   Frozen shoulder    Headache    migraines   Osteoarthritis    AC   Panic attacks    PONV (postoperative nausea and vomiting)    " I had a  mild panic attack coming out of anesthesia"   Wears glasses    Past Surgical History:  Procedure Laterality Date   COLONOSCOPY WITH  ESOPHAGOGASTRODUODENOSCOPY (EGD)     CYST EXCISION     TONSILLECTOMY     Family History  Problem Relation Age of Onset   Cancer Mother        breast   Hypertension Mother    Gout Father    Allergies as of 11/06/2021       Reactions   Flagyl [metronidazole] Diarrhea        Medication List        Accurate as of November 06, 2021  4:56 PM. If you have any questions, ask your nurse or doctor.          ALPRAZolam 0.5 MG tablet Commonly known as: Xanax Take 0.5-1 tablets (0.25-0.5 mg total) by mouth 2 (two) times daily as needed for anxiety.   benzonatate 200 MG capsule Commonly known as: TESSALON Take 1 capsule (200 mg total) by mouth 2 (two) times daily as needed for cough. Started by: Felix Pacini, DO   busPIRone 7.5 MG tablet Commonly known as: BUSPAR TAKE 1 TABLET BY MOUTH 2 TIMES DAILY.   hydrOXYzine 25 MG capsule Commonly known as: VISTARIL Take 1 capsule (25 mg total) by mouth every 8 (eight) hours as needed.   molnupiravir EUA 200 mg Caps capsule Commonly known as: LAGEVRIO Take 4 capsules (800 mg total) by mouth 2 (two) times daily for 5 days. Started by: Felix Pacini, DO  ondansetron 4 MG disintegrating tablet Commonly known as: ZOFRAN-ODT Take 1 tablet (4 mg total) by mouth every 8 (eight) hours as needed for nausea or vomiting.   tiZANidine 4 MG tablet Commonly known as: Zanaflex Take 1 tablet (4 mg total) by mouth every 6 (six) hours as needed for muscle spasms.        All past medical history, surgical history, allergies, family history, immunizations andmedications were updated in the EMR today and reviewed under the history and medication portions of their EMR.     Review of Systems  HENT:  Positive for sinus pain and sore throat.   Neurological:  Positive for headaches.   Negative, with the exception of above mentioned in HPI   Objective:  There were no vitals taken for this visit. There is no height or weight on file to calculate  BMI. Physical Exam Vitals and nursing note reviewed.  Constitutional:      General: She is not in acute distress.    Appearance: Normal appearance. She is normal weight. She is not ill-appearing or toxic-appearing.  Eyes:     Extraocular Movements: Extraocular movements intact.     Conjunctiva/sclera: Conjunctivae normal.     Pupils: Pupils are equal, round, and reactive to light.  Neurological:     Mental Status: She is alert and oriented to person, place, and time. Mental status is at baseline.  Psychiatric:        Mood and Affect: Mood normal.        Behavior: Behavior normal.        Thought Content: Thought content normal.        Judgment: Judgment normal.      No results found. No results found. No results found for this or any previous visit (from the past 24 hour(s)).  Assessment/Plan: Nicole Gray is a 61 y.o. female present for OV for  COVID-19/ Rest, hydrate.  mucinex (DM if cough) Tessalon Perles for cough Molnupiravir prescribed She can take OTC ibuprofen or naproxen if needed for discomforts Reviewed home care instructions for COVID. Advised self-isolation at home for at least 5 days. After 5 days, if improved and fever resolved, can be in public, but should wear a mask around others for an additional 5 days. If symptoms, esp, dyspnea develops/worsens, recommend in-person evaluation at either an urgent care or the emergency room.  Reviewed expectations re: course of current medical issues. Discussed self-management of symptoms. Outlined signs and symptoms indicating need for more acute intervention. Patient verbalized understanding and all questions were answered. Patient received an After-Visit Summary.    No orders of the defined types were placed in this encounter.  Meds ordered this encounter  Medications   molnupiravir EUA (LAGEVRIO) 200 mg CAPS capsule    Sig: Take 4 capsules (800 mg total) by mouth 2 (two) times daily for 5 days.    Dispense:  40  capsule    Refill:  0   benzonatate (TESSALON) 200 MG capsule    Sig: Take 1 capsule (200 mg total) by mouth 2 (two) times daily as needed for cough.    Dispense:  20 capsule    Refill:  0   Referral Orders  No referral(s) requested today     Note is dictated utilizing voice recognition software. Although note has been proof read prior to signing, occasional typographical errors still can be missed. If any questions arise, please do not hesitate to call for verification.   electronically signed by:  Felix Pacini,  DO  [facility] [place]

## 2022-11-16 ENCOUNTER — Other Ambulatory Visit (HOSPITAL_BASED_OUTPATIENT_CLINIC_OR_DEPARTMENT_OTHER): Payer: Self-pay

## 2022-11-16 MED ORDER — INFLUENZA VIRUS VACC SPLIT PF (FLUZONE) 0.5 ML IM SUSY
0.5000 mL | PREFILLED_SYRINGE | Freq: Once | INTRAMUSCULAR | 0 refills | Status: AC
  Filled 2022-11-16: qty 0.5, 1d supply, fill #0
